# Patient Record
Sex: Male | Born: 1970 | Hispanic: No | Marital: Married
Health system: Northeastern US, Academic
[De-identification: ages and names within clinical notes are randomized; demographics above are authoritative.]

## PROBLEM LIST (undated history)

## (undated) DIAGNOSIS — K2270 Barrett's esophagus without dysplasia: Secondary | ICD-10-CM

## (undated) DIAGNOSIS — J302 Other seasonal allergic rhinitis: Secondary | ICD-10-CM

## (undated) HISTORY — PX: KNEE ARTHROSCOPY W/ MENISCAL REPAIR: SHX1877

## (undated) HISTORY — PX: KNEE ARTHROSCOPY W/ ACL RECONSTRUCTION: SHX1858

## (undated) HISTORY — PX: KNEE CARTILAGE SURGERY: SHX688

## (undated) HISTORY — PX: ANTERIOR CRUCIATE LIGAMENT REPAIR: SHX115

---

## 2013-08-25 ENCOUNTER — Encounter (HOSPITAL_COMMUNITY): Payer: Self-pay | Admitting: Emergency Medicine

## 2013-08-25 ENCOUNTER — Emergency Department (INDEPENDENT_AMBULATORY_CARE_PROVIDER_SITE_OTHER)
Admission: EM | Admit: 2013-08-25 | Discharge: 2013-08-25 | Disposition: A | Discharge status: Home / Self Care | Payer: Managed Care, Other (non HMO) | Source: Home / Self Care

## 2013-08-25 DIAGNOSIS — H9202 Otalgia, left ear: Secondary | ICD-10-CM

## 2013-08-25 DIAGNOSIS — H9209 Otalgia, unspecified ear: Secondary | ICD-10-CM

## 2013-08-25 DIAGNOSIS — R0982 Postnasal drip: Secondary | ICD-10-CM

## 2013-08-25 DIAGNOSIS — H6980 Other specified disorders of Eustachian tube, unspecified ear: Secondary | ICD-10-CM

## 2013-08-25 HISTORY — DX: Barrett's esophagus without dysplasia: K22.70

## 2013-08-25 HISTORY — DX: Other seasonal allergic rhinitis: J30.2

## 2013-08-25 MED ORDER — FLUTICASONE PROPIONATE 50 MCG/ACT NA SUSP: 2.0000 | Freq: Every day | NASAL | Status: DC

## 2013-08-25 NOTE — Discharge Instructions (Signed)
Otalgia May take Nyquil OR Allegra D Add the Flonase The most common reason for this in children is an infection of the middle ear. Pain from the middle ear is usually caused by a build-up of fluid and pressure behind the eardrum. Pain from an earache can be sharp, dull, or burning. The pain may be temporary or constant. The middle ear is connected to the nasal passages by a short narrow tube called the Eustachian tube. The Eustachian tube allows fluid to drain out of the middle ear, and helps keep the pressure in your ear equalized. CAUSES  A cold or allergy can block the Eustachian tube with inflammation and the build-up of secretions. This is especially likely in small children, because their Eustachian tube is shorter and more horizontal. When the Eustachian tube closes, the normal flow of fluid from the middle ear is stopped. Fluid can accumulate and cause stuffiness, pain, hearing loss, and an ear infection if germs start growing in this area. SYMPTOMS  The symptoms of an ear infection may include fever, ear pain, fussiness, increased crying, and irritability. Many children will have temporary and minor hearing loss during and right after an ear infection. Permanent hearing loss is rare, but the risk increases the more infections a child has. Other causes of ear pain include retained water in the outer ear canal from swimming and bathing. Ear pain in adults is less likely to be from an ear infection. Ear pain may be referred from other locations. Referred pain may be from the joint between your jaw and the skull. It may also come from a tooth problem or problems in the neck. Other causes of ear pain include:  A foreign body in the ear.  Outer ear infection.  Sinus infections.  Impacted ear wax.  Ear injury.  Arthritis of the jaw or TMJ problems.  Middle ear infection.  Tooth infections.  Sore throat with pain to the ears. DIAGNOSIS  Your caregiver can usually make the diagnosis by  examining you. Sometimes other special studies, including x-rays and lab work may be necessary. TREATMENT   If antibiotics were prescribed, use them as directed and finish them even if you or your child's symptoms seem to be improved.  Sometimes PE tubes are needed in children. These are little plastic tubes which are put into the eardrum during a simple surgical procedure. They allow fluid to drain easier and allow the pressure in the middle ear to equalize. This helps relieve the ear pain caused by pressure changes. HOME CARE INSTRUCTIONS   Only take over-the-counter or prescription medicines for pain, discomfort, or fever as directed by your caregiver. DO NOT GIVE CHILDREN ASPIRIN because of the association of Reye's Syndrome in children taking aspirin.  Use a cold pack applied to the outer ear for 15-20 minutes, 03-04 times per day or as needed may reduce pain. Do not apply ice directly to the skin. You may cause frost bite.  Over-the-counter ear drops used as directed may be effective. Your caregiver may sometimes prescribe ear drops.  Resting in an upright position may help reduce pressure in the middle ear and relieve pain.  Ear pain caused by rapidly descending from high altitudes can be relieved by swallowing or chewing gum. Allowing infants to suck on a bottle during airplane travel can help.  Do not smoke in the house or near children. If you are unable to quit smoking, smoke outside.  Control allergies. SEEK IMMEDIATE MEDICAL CARE IF:   You or  your child are becoming sicker.  Pain or fever relief is not obtained with medicine.  You or your child's symptoms (pain, fever, or irritability) do not improve within 24 to 48 hours or as instructed.  Severe pain suddenly stops hurting. This may indicate a ruptured eardrum.  You or your children develop new problems such as severe headaches, stiff neck, difficulty swallowing, or swelling of the face or around the ear. Document  Released: 12/11/2003 Document Revised: 07/18/2011 Document Reviewed: 04/16/2008 [facility] Patient Information 2014 [place], [place].

## 2013-08-25 NOTE — ED Notes (Signed)
C/O left earache x 3-4 days; describes increased pain when tilting head to shoulder.  Denies cold sxs - states has some nasal congestion, but he normally always does, though it may be increased some lately.

## 2013-08-25 NOTE — ED Provider Notes (Signed)
CSN: [id number]     Arrival date & time 08/25/13  1720 History   First MD Initiated Contact with Patient 08/25/13 1815     Chief Complaint  Patient presents with  . Otalgia   (Consider location/radiation/quality/duration/timing/severity/associated sxs/prior Treatment) HPI Comments: 43 year old male complaining of left ear pain for approximately 4 days. He has a history of acute and chronic PND and nasal congestion.   Past Medical History  Diagnosis Date  . Barrett's esophagus   . Seasonal allergies    Past Surgical History  Procedure Laterality Date  . Knee arthroscopy w/ acl reconstruction    . Knee cartilage surgery     No family history on file. History  Substance Use Topics  . Smoking status: Never Smoker   . Smokeless tobacco: Not on file  . Alcohol Use: Yes     Comment: occasional    Review of Systems  Constitutional: Negative for fever, chills and activity change.  HENT: Positive for congestion, ear pain, postnasal drip and rhinorrhea. Negative for ear discharge.   Respiratory: Negative.   Cardiovascular: Negative.   Neurological: Negative for dizziness, speech difficulty and numbness.    Allergies  Erythromycin  Home Medications   Prior to Admission medications   Medication Sig Start Date End Date Taking? Authorizing Provider  omeprazole (PRILOSEC) 20 MG capsule Take 20 mg by mouth daily.   Yes Historical Provider, MD   BP 131/91  Pulse 73  Temp(Src) 98.8 F (37.1 C) (Oral)  Resp 16  SpO2 97% Physical Exam  Nursing note and vitals reviewed. Constitutional: He is oriented to person, place, and time. He appears well-developed and well-nourished. No distress.  HENT:  Right Ear: External ear normal.  Mouth/Throat: No oropharyngeal exudate.  Left TM retracted. No erythema, effusion, bulging.  R TM nl OP with cobblestoning and clear PND  Eyes: Conjunctivae and EOM are normal.  Neck: Normal range of motion. Neck supple.  Cardiovascular: Normal rate.    Pulmonary/Chest: Effort normal. No respiratory distress.  Lymphadenopathy:    He has no cervical adenopathy.  Neurological: He is alert and oriented to person, place, and time.  Skin: Skin is warm.  Psychiatric: He has a normal mood and affect.    ED Course  Procedures (including critical care time) Labs Review Labs Reviewed - No data to display  No results found for this or any previous visit. Imaging Review No results found.   MDM   1. ETD (eustachian tube dysfunction)   2. Otalgia of left ear   3. PND (post-nasal drip)     R TM retraced no signs of infection or trauma Allegra D or NYquil as discussed. FLonase NS Lots of fluids    Hayden Rasmussenavid Brenlynn Fake, NP 08/25/13 [phone]

## 2013-08-26 NOTE — ED Provider Notes (Signed)
Medical screening examination/treatment/procedure(s) were performed by non-physician practitioner and as supervising physician I was immediately available for consultation/collaboration.  Leslee Homeavid Wynonna Fitzhenry, M.D.  Reuben Likesavid C Kassia Demarinis, MD 08/26/13 1120

## 2013-09-06 ENCOUNTER — Telehealth (HOSPITAL_COMMUNITY): Payer: Self-pay

## 2013-09-06 NOTE — ED Notes (Signed)
Patient called stating that RX was OTC and was too expensive.  Chart reviewed by Dr Denyse Amassorey.  He stated there was not a better alternative that would be cheaper.  Attempted to call patient there was no answer

## 2013-10-02 ENCOUNTER — Ambulatory Visit (HOSPITAL_BASED_OUTPATIENT_CLINIC_OR_DEPARTMENT_OTHER): Payer: Managed Care, Other (non HMO) | Attending: Emergency Medicine | Admitting: Radiology

## 2013-10-02 VITALS — Ht 67.0 in | Wt 155.0 lb

## 2013-10-02 DIAGNOSIS — G4733 Obstructive sleep apnea (adult) (pediatric): Secondary | ICD-10-CM | POA: Insufficient documentation

## 2013-10-06 DIAGNOSIS — G4730 Sleep apnea, unspecified: Secondary | ICD-10-CM

## 2013-10-06 DIAGNOSIS — G4710 Hypersomnia, unspecified: Secondary | ICD-10-CM

## 2013-10-06 NOTE — Sleep Study (Signed)
   NAME: Andrew Dawson DATE OF BIRTH:  [birth year] MEDICAL RECORD NUMBER [id number]  LOCATION: [facility]  PHYSICIAN: Clinton D Young  DATE OF STUDY: 10/02/2013  SLEEP STUDY TYPE: Nocturnal Polysomnogram               REFERRING PHYSICIAN: Charna Archer, MD  INDICATION FOR STUDY: Hypersomnia with sleep apnea  EPWORTH SLEEPINESS SCORE:   6/24 HEIGHT: 5\' 7"  (170.2 cm)  WEIGHT: 155 lb (70.308 kg)    Body mass index is 24.27 kg/(m^2).  NECK SIZE: 15.5 in.  MEDICATIONS: Charted for review  SLEEP ARCHITECTURE: Total sleep time 287 minutes with sleep efficiency 78.5%. Stage I was 3%, stage II 77.2%, stage III 0.2%, REM 19.7%. Sleep latency 5.5 minutes, REM latency 146.5 minutes, awake after sleep onset 73 minutes, arousal index 10.9, bedtime medication: None  RESPIRATORY DATA: Apnea hypopnea index (AHI) 6.5 per hour. 31 total events scored including 22 obstructive apneas, 4 central apneas, 5 hypopneas. Events were not positional. REM AHI 19.1 per hour. There were not enough early events to meet protocol requirements for split CPAP titration on this study.  OXYGEN DATA: Moderate snoring with oxygen desaturation to a nadir of 87% and mean oxygen saturation through the study of 97% on room air.  CARDIAC DATA: Sinus rhythm with PVCs  MOVEMENT/PARASOMNIA: No significant movement disturbance, bathroom x1  IMPRESSION/ RECOMMENDATION:   1) Mild obstructive sleep apnea/hypoxia syndrome, AHI 6.5 per hour with non-positional events. Moderate snoring with oxygen desaturation to a nadir of 87% and mean oxygen saturation through the study of 97% on room air. 2) There were not enough early events to meet protocol requirements for split CPAP titration on this study. If conservative management such as weight loss is not sufficient, then CPAP or an oral appliance might be considered on an individual basis. This patient can be scheduled for a dedicated CPAP titration study through the  [facility] if clinically appropriate.   Signed Jetty Duhamel M.D. Waymon Budge Diplomate, Biomedical engineer of Sleep Medicine  ELECTRONICALLY SIGNED ON:  10/06/2013, 11:40 AM [facility] PH: [phone]   FX: [phone] ACCREDITED BY THE [employer]ICAN AC[employer]EM[employer] OF SLEEP MEDICINE

## 2013-10-10 ENCOUNTER — Encounter (HOSPITAL_COMMUNITY): Payer: Self-pay | Admitting: Emergency Medicine

## 2013-10-10 ENCOUNTER — Emergency Department (INDEPENDENT_AMBULATORY_CARE_PROVIDER_SITE_OTHER)
Admission: EM | Admit: 2013-10-10 | Discharge: 2013-10-10 | Disposition: A | Discharge status: Home / Self Care | Payer: Managed Care, Other (non HMO) | Source: Home / Self Care

## 2013-10-10 DIAGNOSIS — S39012A Strain of muscle, fascia and tendon of lower back, initial encounter: Secondary | ICD-10-CM

## 2013-10-10 DIAGNOSIS — R198 Other specified symptoms and signs involving the digestive system and abdomen: Secondary | ICD-10-CM

## 2013-10-10 DIAGNOSIS — K047 Periapical abscess without sinus: Secondary | ICD-10-CM

## 2013-10-10 DIAGNOSIS — K044 Acute apical periodontitis of pulpal origin: Secondary | ICD-10-CM

## 2013-10-10 DIAGNOSIS — IMO0002 Reserved for concepts with insufficient information to code with codable children: Secondary | ICD-10-CM

## 2013-10-10 MED ORDER — METHYLPREDNISOLONE (PAK) 4 MG PO TABS: 4.0000 mg | ORAL_TABLET | Freq: Every day | ORAL | Status: DC

## 2013-10-10 MED ORDER — AMOXICILLIN 500 MG PO CAPS: 500.0000 mg | ORAL_CAPSULE | Freq: Three times a day (TID) | ORAL | Status: DC
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
## 2013-10-10 NOTE — Discharge Instructions (Signed)
Back Exercises These exercises may help you when beginning to rehabilitate your injury. Your symptoms may resolve with or without further involvement from your physician, physical therapist or athletic trainer. While completing these exercises, remember:   Restoring tissue flexibility helps normal motion to return to the joints. This allows healthier, less painful movement and activity.  An effective stretch should be held for at least 30 seconds.  A stretch should never be painful. You should only feel a gentle lengthening or release in the stretched tissue. STRETCH  Extension, Prone on Elbows   Lie on your stomach on the floor, a bed will be too soft. Place your palms about shoulder width apart and at the height of your head.  Place your elbows under your shoulders. If this is too painful, stack pillows under your chest.  Allow your body to relax so that your hips drop lower and make contact more completely with the floor.  Hold this position for __________ seconds.  Slowly return to lying flat on the floor. Repeat __________ times. Complete this exercise __________ times per day.  RANGE OF MOTION  Extension, Prone Press Ups   Lie on your stomach on the floor, a bed will be too soft. Place your palms about shoulder width apart and at the height of your head.  Keeping your back as relaxed as possible, slowly straighten your elbows while keeping your hips on the floor. You may adjust the placement of your hands to maximize your comfort. As you gain motion, your hands will come more underneath your shoulders.  Hold this position __________ seconds.  Slowly return to lying flat on the floor. Repeat __________ times. Complete this exercise __________ times per day.  RANGE OF MOTION- Quadruped, Neutral Spine   Assume a hands and knees position on a firm surface. Keep your hands under your shoulders and your knees under your hips. You may place padding under your knees for comfort.  Drop  your head and point your tail bone toward the ground below you. This will round out your low back like an angry cat. Hold this position for __________ seconds.  Slowly lift your head and release your tail bone so that your back sags into a large arch, like an old horse.  Hold this position for __________ seconds.  Repeat this until you feel limber in your low back.  Now, find your "sweet spot." This will be the most comfortable position somewhere between the two previous positions. This is your neutral spine. Once you have found this position, tense your stomach muscles to support your low back.  Hold this position for __________ seconds. Repeat __________ times. Complete this exercise __________ times per day.  STRETCH  Flexion, Single Knee to Chest   Lie on a firm bed or floor with both legs extended in front of you.  Keeping one leg in contact with the floor, bring your opposite knee to your chest. Hold your leg in place by either grabbing behind your thigh or at your knee.  Pull until you feel a gentle stretch in your low back. Hold __________ seconds.  Slowly release your grasp and repeat the exercise with the opposite side. Repeat __________ times. Complete this exercise __________ times per day.  STRETCH - Hamstrings, Standing  Stand or sit and extend your right / left leg, placing your foot on a chair or foot stool  Keeping a slight arch in your low back and your hips straight forward.  Lead with your chest and  lean forward at the waist until you feel a gentle stretch in the back of your right / left knee or thigh. (When done correctly, this exercise requires leaning only a small distance.)  Hold this position for __________ seconds. Repeat __________ times. Complete this stretch __________ times per day. STRENGTHENING  Deep Abdominals, Pelvic Tilt   Lie on a firm bed or floor. Keeping your legs in front of you, bend your knees so they are both pointed toward the ceiling and  your feet are flat on the floor.  Tense your lower abdominal muscles to press your low back into the floor. This motion will rotate your pelvis so that your tail bone is scooping upwards rather than pointing at your feet or into the floor.  With a gentle tension and even breathing, hold this position for __________ seconds. Repeat __________ times. Complete this exercise __________ times per day.  STRENGTHENING  Abdominals, Crunches   Lie on a firm bed or floor. Keeping your legs in front of you, bend your knees so they are both pointed toward the ceiling and your feet are flat on the floor. Cross your arms over your chest.  Slightly tip your chin down without bending your neck.  Tense your abdominals and slowly lift your trunk high enough to just clear your shoulder blades. Lifting higher can put excessive stress on the low back and does not further strengthen your abdominal muscles.  Control your return to the starting position. Repeat __________ times. Complete this exercise __________ times per day.  STRENGTHENING  Quadruped, Opposite UE/LE Lift   Assume a hands and knees position on a firm surface. Keep your hands under your shoulders and your knees under your hips. You may place padding under your knees for comfort.  Find your neutral spine and gently tense your abdominal muscles so that you can maintain this position. Your shoulders and hips should form a rectangle that is parallel with the floor and is not twisted.  Keeping your trunk steady, lift your right hand no higher than your shoulder and then your left leg no higher than your hip. Make sure you are not holding your breath. Hold this position __________ seconds.  Continuing to keep your abdominal muscles tense and your back steady, slowly return to your starting position. Repeat with the opposite arm and leg. Repeat __________ times. Complete this exercise __________ times per day. Document Released: 05/13/2005 Document  Revised: 07/18/2011 Document Reviewed: 08/07/2008 [facility] Patient Information 2014 [place], [place]. Abscessed Tooth An abscessed tooth is an infection around your tooth. It may be caused by holes or damage to the tooth (cavity) or a dental disease. An abscessed tooth causes mild to very bad pain in and around the tooth. See your dentist right away if you have tooth or gum pain. HOME CARE  Take your medicine as told. Finish it even if you start to feel better.  Do not drive after taking pain medicine.  Rinse your mouth (gargle) often with salt water ( teaspoon salt in 8 ounces of warm water).  Do not apply heat to the outside of your face. GET HELP RIGHT AWAY IF:   You have a temperature by mouth above 102 F (38.9 C), not controlled by medicine.  You have chills and a very bad headache.  You have problems breathing or swallowing.  Your mouth will not open.  You develop puffiness (swelling) on the neck or around the eye.  Your pain is not helped by medicine.  Your  pain is getting worse instead of better. MAKE SURE YOU:   Understand these instructions.  Will watch your condition.  Will get help right away if you are not doing well or get worse. Document Released: 10/12/2007 Document Revised: 07/18/2011 Document Reviewed: 08/03/2010 [facility] Patient Information 2014 [place], [place].

## 2013-10-11 NOTE — ED Provider Notes (Signed)
Medical screening examination/treatment/procedure(s) were performed by resident physician or non-physician practitioner and as supervising physician I was immediately available for consultation/collaboration.   Barkley Bruns MD.   Linna Hoff, MD 10/11/13 [phone]

## 2013-10-18 ENCOUNTER — Emergency Department (HOSPITAL_COMMUNITY): Payer: Managed Care, Other (non HMO)

## 2013-10-18 ENCOUNTER — Encounter (HOSPITAL_COMMUNITY): Payer: Self-pay | Admitting: Emergency Medicine

## 2013-10-18 DIAGNOSIS — Z8719 Personal history of other diseases of the digestive system: Secondary | ICD-10-CM | POA: Insufficient documentation

## 2013-10-18 DIAGNOSIS — R072 Precordial pain: Secondary | ICD-10-CM | POA: Insufficient documentation

## 2013-10-18 LAB — BASIC METABOLIC PANEL
BUN: 13 mg/dL (ref 6–23)
CO2: 23 mEq/L (ref 19–32)
Calcium: 9.4 mg/dL (ref 8.4–10.5)
Chloride: 100 mEq/L (ref 96–112)
Creatinine, Ser: 0.91 mg/dL (ref 0.50–1.35)
GFR calc Af Amer: >90 mL/min (ref >90)
Glucose, Bld: 138 mg/dL — ABNORMAL HIGH (ref 70–99)
POTASSIUM: 3.8 meq/L (ref 3.7–5.3)
SODIUM: 139 meq/L (ref 137–147)

## 2013-10-18 LAB — CBC
HCT: 38.8 % — ABNORMAL LOW (ref 39.0–52.0)
HEMOGLOBIN: 12.9 g/dL — AB (ref 13.0–17.0)
MCH: 27.1 pg (ref 26.0–34.0)
MCHC: 33.2 g/dL (ref 30.0–36.0)
MCV: 81.5 fL (ref 78.0–100.0)
PLATELETS: 318 10*3/uL (ref 150–400)
RBC: 4.76 MIL/uL (ref 4.22–5.81)
RDW: 13.1 % (ref 11.5–15.5)
WBC: 5.5 10*3/uL (ref 4.0–10.5)

## 2013-10-18 LAB — TROPONIN I: Troponin I: <0.3 ng/mL (ref <0.30)

## 2013-10-18 NOTE — ED Notes (Signed)
Pt reports intermittent cp for approx 2 days but today is worse. Pt describes pain as shooting into L chest lasting maybe at a time. Pt has slight sob. Denies nv. No cardiac hx. Pt skin warm and dry. No distress noted.

## 2013-10-19 ENCOUNTER — Emergency Department (HOSPITAL_COMMUNITY)
Admission: EM | Admit: 2013-10-19 | Discharge: 2013-10-19 | Disposition: A | Discharge status: Home / Self Care | Payer: Managed Care, Other (non HMO) | Attending: Emergency Medicine | Admitting: Emergency Medicine

## 2013-10-19 DIAGNOSIS — R079 Chest pain, unspecified: Secondary | ICD-10-CM

## 2013-10-19 LAB — TROPONIN I

## 2013-10-19 MED ORDER — GI COCKTAIL ~~LOC~~
30.0000 mL | Freq: Once | ORAL | Status: AC
  Administered 2013-10-19: 30 mL via ORAL
  Filled 2013-10-19: qty 30

## 2013-10-19 MED ORDER — ASPIRIN 81 MG PO CHEW
324.0000 mg | CHEWABLE_TABLET | Freq: Once | ORAL | Status: AC
  Administered 2013-10-19: 324 mg via ORAL
  Filled 2013-10-19: qty 4

## 2013-10-19 NOTE — ED Provider Notes (Signed)
CSN: [id number]     Arrival date & time 10/18/13  2224 History   First MD Initiated Contact with Patient 10/19/13 0026     Chief Complaint  Patient presents with  . Chest Pain   HPI  History provided by the patient. Patient is a 43 year old male with history of acid reflux and Barrett's esophagus who presents with complaints of intermittent left and substernal chest pain. Patient reports noticing intermittent sharp and uncomfortable chest pains for the past 2 days. He states pain seems to come at any time. It often lasts 20-30 minutes and then is gone for several hours. He has not noticed any worse pain with activity. He did not notice any change of symptoms or pain with eating or drinking. 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 He does report some increased stress with work but has not noticed any particular stress causing his pain symptoms to begin. He denies any associated shortness of breath, diaphoresis, nausea or lightheadedness. Pain does not radiate. Patient is a nondrinker and nonsmoker. He has no significant family history of ACS. Denies any pleuritic pain or hemoptysis. No other aggravating or alleviating factors. No other associated symptoms.    Past Medical History  Diagnosis Date  . Barrett's esophagus    Past Surgical History  Procedure Laterality Date  . Knee arthroscopy w/ meniscal repair    . Anterior cruciate ligament repair     No family history on file. History  Substance Use Topics  . Smoking status: Never Smoker   . Smokeless tobacco: Not on file  . Alcohol Use: Yes     Comment: occasional     Review of Systems  Constitutional: Negative for fever, chills and diaphoresis.  Respiratory: Negative for cough and shortness of breath.   Cardiovascular: Positive for chest pain. Negative for palpitations.  Gastrointestinal: Negative for nausea, vomiting, abdominal pain, diarrhea and constipation.  All other systems reviewed and  are negative.     Allergies  Azithromycin  Home Medications   Prior to Admission medications   Not on File   BP 124/86  Pulse 73  Temp(Src) 97.7 F (36.5 C) (Oral)  Resp 16  SpO2 99% Physical Exam  Nursing note and vitals reviewed. Constitutional: He is oriented to person, place, and time. He appears well-developed and well-nourished. No distress.  HENT:  Head: Normocephalic and atraumatic.  Cardiovascular: Normal rate and regular rhythm.   No murmur heard. Pulmonary/Chest: Effort normal and breath sounds normal. No respiratory distress. He has no wheezes. He has no rales. He exhibits no tenderness.  Abdominal: Soft. Bowel sounds are normal. There is no tenderness. There is no rebound and no guarding.  Musculoskeletal: Normal range of motion. He exhibits no edema and no tenderness.  Neurological: He is alert and oriented to person, place, and time.  Skin: Skin is warm. No rash noted. He is not diaphoretic. No erythema.    ED Course  Procedures  COORDINATION OF CARE:  Nursing notes reviewed. Vital signs reviewed. Initial pt interview and examination performed.   Filed Vitals:   10/18/13 2235 10/19/13 0017  BP: 144/92 124/86  Pulse: 73   Temp: 97.7 F (36.5 C)   TempSrc: Oral   Resp: 15 16  SpO2: 96% 99%    12:54 AM-patient seen and evaluated. Patient appears well no acute distress. He continues to feel a deep left-sided chest discomfort that is slightly sharp 4/10 pain. No other symptoms. Patient without any significant  cardiac history.  Patient reports improvement of symptoms after GI cocktail. He is feeling much better and resting. Second troponin negative. Patient low risk for ACS. Does have history of GERD and Barrett's esophagus. At this time we'll recommend continued outpatient evaluation of symptoms. She Critcher precautions given and patient agrees.   Treatment plan initiated: Medications  aspirin chewable tablet 324 mg (324 mg Oral Given 10/19/13 0106)   gi cocktail (Maalox,Lidocaine,Donnatal) (30 mLs Oral Given 10/19/13 0106)      Results for orders placed during the hospital encounter of 10/19/13  CBC      Result Value Ref Range   WBC 5.5  4.0 - 10.5 K/uL   RBC 4.76  4.22 - 5.81 MIL/uL   Hemoglobin 12.9 (*) 13.0 - 17.0 g/dL   HCT 16.138.8 (*) 09.639.0 - 04.552.0 %   MCV 81.5  78.0 - 100.0 fL   MCH 27.1  26.0 - 34.0 pg   MCHC 33.2  30.0 - 36.0 g/dL   RDW 40.913.1  81.111.5 - 91.415.5 %   Platelets 318  150 - 400 K/uL  BASIC METABOLIC PANEL      Result Value Ref Range   Sodium 139  137 - 147 mEq/L   Potassium 3.8  3.7 - 5.3 mEq/L   Chloride 100  96 - 112 mEq/L   CO2 23  19 - 32 mEq/L   Glucose, Bld 138 (*) 70 - 99 mg/dL   BUN 13  6 - 23 mg/dL   Creatinine, Ser 7.820.91  0.50 - 1.35 mg/dL   Calcium 9.4  8.4 - 95.610.5 mg/dL   GFR calc non Af Amer >90  >90 mL/min   GFR calc Af Amer >90  >90 mL/min  TROPONIN I      Result Value Ref Range   Troponin I <0.30  <0.30 ng/mL  TROPONIN I      Result Value Ref Range   Troponin I <0.30  <0.30 ng/mL      Imaging Review Dg Chest 2 View  10/18/2013   CLINICAL DATA:  Intermittent left chest pain  EXAM: CHEST  2 VIEW  COMPARISON:  None.  FINDINGS: Lungs are clear.  No pleural effusion or pneumothorax.  The heart is normal in size.  Mild degenerative changes of the visualized thoracolumbar spine.  IMPRESSION: No evidence of acute cardiopulmonary disease.   Electronically Signed   By: Charline BillsSriyesh  Krishnan M.D.   On: 10/18/2013 23:58     EKG Interpretation None      Date: 10/19/2013  Rate: 67  Rhythm: normal sinus rhythm  QRS Axis: normal  Intervals: normal  ST/T Wave abnormalities: normal  Conduction Disutrbances:none  Narrative Interpretation:   Old EKG Reviewed: none available     MDM   Final diagnoses:  Chest pain       Angus Sellereter S Nashika Coker, PA-C 10/19/13 [phone]

## 2013-10-19 NOTE — Discharge Instructions (Signed)
You were seen and evaluated for your symptoms of chest pain and discomfort. Your laboratory testing, EKG of your heart and chest x-ray have not shown any signs for a concerning for emergent cause of your symptoms. At this time your providers feel that you may return home and followup with a primary care provider for continued evaluation and treatment. Return at any time for changing or worsening symptoms.    Chest Pain (Nonspecific) It is often hard to give a specific diagnosis for the cause of chest pain. There is always a chance that your pain could be related to something serious, such as a heart attack or a blood clot in the lungs. You need to follow up with your caregiver for further evaluation. CAUSES   Heartburn.  Pneumonia or bronchitis.  Anxiety or stress.  Inflammation around your heart (pericarditis) or lung (pleuritis or pleurisy).  A blood clot in the lung.  A collapsed lung (pneumothorax). It can develop suddenly on its own (spontaneous pneumothorax) or from injury (trauma) to the chest.  Shingles infection (herpes zoster virus). The chest wall is composed of bones, muscles, and cartilage. Any of these can be the source of the pain.  The bones can be bruised by injury.  The muscles or cartilage can be strained by coughing or overwork.  The cartilage can be affected by inflammation and become sore (costochondritis). DIAGNOSIS  Lab tests or other studies, such as X-rays, electrocardiography, stress testing, or cardiac imaging, may be needed to find the cause of your pain.  TREATMENT   Treatment depends on what may be causing your chest pain. Treatment may include:  Acid blockers for heartburn.  Anti-inflammatory medicine.  Pain medicine for inflammatory conditions.  Antibiotics if an infection is present.  You may be advised to change lifestyle habits. This includes stopping smoking and avoiding alcohol, caffeine, and chocolate.  You may be advised to keep your  head raised (elevated) when sleeping. This reduces the chance of acid going backward from your stomach into your esophagus.  Most of the time, nonspecific chest pain will improve within 2 to 3 days with rest and mild pain medicine. HOME CARE INSTRUCTIONS   If antibiotics were prescribed, take your antibiotics as directed. Finish them even if you start to feel better.  For the next few days, avoid physical activities that bring on chest pain. Continue physical activities as directed.  Do not smoke.  Avoid drinking alcohol.  Only take over-the-counter or prescription medicine for pain, discomfort, or fever as directed by your caregiver.  Follow your caregiver's suggestions for further testing if your chest pain does not go away.  Keep any follow-up appointments you made. If you do not go to an appointment, you could develop lasting (chronic) problems with pain. If there is any problem keeping an appointment, you must call to reschedule. SEEK MEDICAL CARE IF:   You think you are having problems from the medicine you are taking. Read your medicine instructions carefully.  Your chest pain does not go away, even after treatment.  You develop a rash with blisters on your chest. SEEK IMMEDIATE MEDICAL CARE IF:   You have increased chest pain or pain that spreads to your arm, neck, jaw, back, or abdomen.  You develop shortness of breath, an increasing cough, or you are coughing up blood.  You have severe back or abdominal pain, feel nauseous, or vomit.  You develop severe weakness, fainting, or chills.  You have a fever. THIS IS AN EMERGENCY. Do  not wait to see if the pain will go away. Get medical help at once. Call your local emergency services (911 in [place] Do not drive yourself to the hospital. MAKE SURE YOU:   Understand these instructions.  Will watch your condition.  Will get help right away if you are not doing well or get worse. Document Released: 02/02/2005 Document  Revised: 07/18/2011 Document Reviewed: 11/29/2007 [facility] Patient Information 2014 [place], [place].

## 2013-10-25 NOTE — ED Provider Notes (Signed)
Medical screening examination/treatment/procedure(s) were performed by non-physician practitioner and as supervising physician I was immediately available for consultation/collaboration.   EKG Interpretation   Date/Time:  Friday October 18 2013 22:29:57 EDT Ventricular Rate:  67 PR Interval:  124 QRS Duration: 80 QT Interval:  394 QTC Calculation: 416 R Axis:   43 Text Interpretation:  Normal sinus rhythm Normal ECG ED PHYSICIAN  INTERPRETATION AVAILABLE IN [facility] Confirmed by TEST, Record  ([id number]) on 10/20/2013 9:14:09 AM        Brandt LoosenJulie Evita Merida, MD 10/25/13 [phone]

## 2014-02-11 DIAGNOSIS — M2352 Chronic instability of knee, left knee: Secondary | ICD-10-CM | POA: Insufficient documentation

## 2014-02-11 DIAGNOSIS — M2351 Chronic instability of knee, right knee: Secondary | ICD-10-CM | POA: Insufficient documentation

## 2014-02-11 DIAGNOSIS — M94269 Chondromalacia, unspecified knee: Secondary | ICD-10-CM | POA: Insufficient documentation

## 2014-02-12 ENCOUNTER — Encounter (HOSPITAL_COMMUNITY): Payer: Self-pay | Admitting: Emergency Medicine

## 2014-02-12 ENCOUNTER — Emergency Department (INDEPENDENT_AMBULATORY_CARE_PROVIDER_SITE_OTHER)
Admission: EM | Admit: 2014-02-12 | Discharge: 2014-02-12 | Disposition: A | Discharge status: Home / Self Care | Payer: Managed Care, Other (non HMO) | Source: Home / Self Care

## 2014-02-12 DIAGNOSIS — J0110 Acute frontal sinusitis, unspecified: Secondary | ICD-10-CM

## 2014-02-12 DIAGNOSIS — R05 Cough: Secondary | ICD-10-CM

## 2014-02-12 DIAGNOSIS — J309 Allergic rhinitis, unspecified: Secondary | ICD-10-CM

## 2014-02-12 DIAGNOSIS — R059 Cough, unspecified: Secondary | ICD-10-CM

## 2014-02-12 DIAGNOSIS — R062 Wheezing: Secondary | ICD-10-CM

## 2014-02-12 MED ORDER — FLUTICASONE PROPIONATE 50 MCG/ACT NA SUSP: 2.0000 | Freq: Every day | NASAL | Status: DC

## 2014-02-12 MED ORDER — PREDNISONE 20 MG PO TABS: 60.0000 mg | ORAL_TABLET | Freq: Every day | ORAL | Status: DC

## 2014-02-12 MED ORDER — IPRATROPIUM-ALBUTEROL 0.5-2.5 (3) MG/3ML IN SOLN
3.0000 mL | Freq: Once | RESPIRATORY_TRACT | Status: AC
  Administered 2014-02-12: 3 mL via RESPIRATORY_TRACT

## 2014-02-12 MED ORDER — IPRATROPIUM-ALBUTEROL 0.5-2.5 (3) MG/3ML IN SOLN
RESPIRATORY_TRACT | Status: AC
  Filled 2014-02-12: qty 3

## 2014-02-12 MED ORDER — AMOXICILLIN 500 MG PO CAPS: 500.0000 mg | ORAL_CAPSULE | Freq: Three times a day (TID) | ORAL | Status: DC

## 2014-02-12 MED ORDER — CETIRIZINE-PSEUDOEPHEDRINE ER 5-120 MG PO TB12: 1.0000 | ORAL_TABLET | Freq: Every day | ORAL | Status: DC

## 2014-02-12 MED ORDER — ALBUTEROL SULFATE HFA 108 (90 BASE) MCG/ACT IN AERS: 1.0000 | INHALATION_SPRAY | Freq: Four times a day (QID) | RESPIRATORY_TRACT | Status: DC | PRN

## 2014-02-12 NOTE — ED Notes (Signed)
Patient complains of cough congestion fever the past four days Patient is using OTC medications with no relief

## 2014-02-12 NOTE — Discharge Instructions (Signed)
A neti pot is a container designed to rinse debris or mucus from your nasal cavity. You might use a neti pot to treat symptoms of nasal allergies, sinus problems or colds. °If you choose to make your own saltwater solution, it's important to use bottled water that has been distilled or sterilized. Tap water is acceptable if it's been boiled for several minutes and then left to cool until it is lukewarm. °To use the neti pot, tilt your head sideways over the sink and place the spout of the neti pot in the upper nostril. Breathing through your open mouth, gently pour the saltwater solution into your upper nostril so that the liquid drains through the lower nostril. Repeat on the other side. °Be sure to rinse the irrigation device after each use with similarly distilled, sterile, previously boiled and cooled, or filtered water and leave open to air dry. °Neti pots are often available in pharmacies and health food stores, as well online.  ° ° °Bronchospasm °A bronchospasm is when the tubes that carry air in and out of your lungs (airways) spasm or tighten. During a bronchospasm it is hard to breathe. This is because the airways get smaller. A bronchospasm can be triggered by: °· Allergies. These may be to animals, pollen, food, or mold. °· Infection. This is a common cause of bronchospasm. °· Exercise. °· Irritants. These include pollution, cigarette smoke, strong odors, aerosol sprays, and paint fumes. °· Weather changes. °· Stress. °· Being emotional. °HOME CARE  °· Always have a plan for getting help. Know when to call your doctor and local emergency services (911 in the [place] Know where you can get emergency care. °· Only take medicines as told by your doctor. °· If you were prescribed an inhaler or nebulizer machine, ask your doctor how to use it correctly. Always use a spacer with your inhaler if you were given one. °· Stay calm during an attack. Try to relax and breathe more slowly. °· Control your home  environment: °¨ Change your heating and air conditioning filter at least once a month. °¨ Limit your use of fireplaces and wood stoves. °¨ Do not  smoke. Do not  allow smoking in your home. °¨ Avoid perfumes and fragrances. °¨ Get rid of pests (such as roaches and mice) and their droppings. °¨ Throw away plants if you see mold on them. °¨ Keep your house clean and dust free. °¨ Replace carpet with wood, tile, or vinyl flooring. Carpet can trap dander and dust. °¨ Use allergy-proof pillows, mattress covers, and box spring covers. °¨ Wash bed sheets and blankets every week in hot water. Dry them in a dryer. °¨ Use blankets that are made of polyester or cotton. °¨ Wash hands frequently. °GET HELP IF: °· You have muscle aches. °· You have chest pain. °· The thick spit you spit or cough up (sputum) changes from clear or white to yellow, green, gray, or bloody. °· The thick spit you spit or cough up gets thicker. °· There are problems that may be related to the medicine you are given such as: °¨ A rash. °¨ Itching. °¨ Swelling. °¨ Trouble breathing. °GET HELP RIGHT AWAY IF: °· You feel you cannot breathe or catch your breath. °· You cannot stop coughing. °· Your treatment is not helping you breathe better. °· You have very bad chest pain. °MAKE SURE YOU:  °· Understand these instructions. °· Will watch your condition. °· Will get help right away if you are not   doing well or get worse. °Document Released: 02/20/2009 Document Revised: 04/30/2013 Document Reviewed: 10/16/2012 °Exit[employer]® Patient Information ©2015 [employer]. This information is not intended to replace advice given to you by your health care provider. Make sure you discuss any questions you have with your health care provider. ° ° °

## 2014-02-12 NOTE — ED Provider Notes (Signed)
CSN: [id number]     Arrival date & time 02/12/14  1923 History   None    Chief Complaint  Patient presents with  . Nasal Congestion   (Consider location/radiation/quality/duration/timing/severity/associated sxs/prior Treatment)  HPI  Patient is a 43 year old male presenting today with complaints of cough,congestion and difficulty breathing through his nose for approximately the past 3-4 days. Patient states he has had an intermittent low-grade fever over the past few days and reports some sneezing and itching. Patient states he's been taking Tylenol, Allegra and Flonase with minimal relief.  Past Medical History  Diagnosis Date  . Barrett's esophagus    Past Surgical History  Procedure Laterality Date  . Knee arthroscopy w/ meniscal repair    . Anterior cruciate ligament repair     No family history on file. History  Substance Use Topics  . Smoking status: Never Smoker   . Smokeless tobacco: Not on file  . Alcohol Use: Yes     Comment: occasional     Review of Systems  Constitutional: Positive for fever and fatigue.  HENT: Positive for congestion, postnasal drip, rhinorrhea, sinus pressure and sneezing. Negative for ear pain, facial swelling, sore throat and trouble swallowing.   Eyes: Negative.   Respiratory: Positive for cough and shortness of breath. Negative for choking, chest tightness, wheezing and stridor.   Cardiovascular: Negative.  Negative for chest pain, palpitations and leg swelling.  Gastrointestinal: Negative.  Negative for nausea, vomiting, diarrhea and constipation.  Endocrine: Negative.   Genitourinary: Negative.   Musculoskeletal: Negative.  Negative for arthralgias, joint swelling, myalgias, neck pain and neck stiffness.  Skin: Negative.  Negative for rash.  Allergic/Immunologic: Positive for environmental allergies.  Neurological: Negative.   Hematological: Negative.   Psychiatric/Behavioral: Negative.     Allergies  Erythromycin  Home  Medications   Prior to Admission medications   Medication Sig Start Date End Date Taking? Authorizing Provider  Acetaminophen (TYLENOL PO) Take 400 mg by mouth 2 (two) times daily.    Historical Provider, MD  albuterol (PROVENTIL HFA;VENTOLIN HFA) 108 (90 BASE) MCG/ACT inhaler Inhale 1-2 puffs into the lungs every 6 (six) hours as needed for wheezing or shortness of breath. 02/12/14   Servando Salina, NP  amoxicillin (AMOXIL) 500 MG capsule Take 1 capsule (500 mg total) by mouth 3 (three) times daily. 02/12/14   Servando Salina, NP  cetirizine-pseudoephedrine (ZYRTEC-D) 5-120 MG per tablet Take 1 tablet by mouth daily. 02/12/14   Servando Salina, NP  fluticasone (FLONASE) 50 MCG/ACT nasal spray Place 2 sprays into both nostrils daily. 02/12/14   Servando Salina, NP  omeprazole (PRILOSEC) 20 MG capsule Take 20 mg by mouth daily.    Historical Provider, MD  predniSONE (DELTASONE) 20 MG tablet Take 3 tablets (60 mg total) by mouth daily. 02/12/14   Servando Salina, NP   BP 140/95  Pulse 92  Temp(Src) 99.2 F (37.3 C) (Oral)  Resp 16  SpO2 98%  Physical Exam  Nursing note and vitals reviewed. Constitutional: He is oriented to person, place, and time. He appears well-developed and well-nourished. No distress.  HENT:  Head: Normocephalic and atraumatic.  Right Ear: External ear normal.  Left Ear: External ear normal.  Mouth/Throat: Oropharynx is clear and moist.  Mild erythema noted in posterior oropharynx. No evidence of exit or patches. Small amount of green yellow mucoid discharge present. Bilateral tympanic membranes pearly gray in appearance with light reflexes present and bony prominences visualized. Bilateral nasal turbinates boggy and swollen  in appearance patient is able to force air through them for patency.  Neck: Normal range of motion. Neck supple.  Mild anterior cervical lymphadenopathy present bilaterally. Negative nuchal rigidity.  Cardiovascular: Normal rate, regular  rhythm, normal heart sounds and intact distal pulses.  Exam reveals no gallop and no friction rub.   No murmur heard. Pulmonary/Chest: No respiratory distress. He has wheezes. He has no rales. He exhibits no tenderness.  Expiratory wheezes noted with prolonged expiration and coughing with deep breathing.  Musculoskeletal: Normal range of motion. He exhibits no edema and no tenderness.  Lymphadenopathy:    He has cervical adenopathy.  Neurological: He is alert and oriented to person, place, and time.  Skin: Skin is warm and dry. No rash noted. He is not diaphoretic.   Patient's lungs reevaluated following DuoNeb treatment. No evidence of expiratory wheezing. Patient able to take a good full deep breath without prolonged expiration or coughing.   ED Course  Procedures (including critical care time) Labs Review Labs Reviewed - No data to display  Imaging Review No results found.   MDM   1. Allergic rhinitis, unspecified allergic rhinitis type   2. Wheezing   3. Cough   4. Acute frontal sinusitis, recurrence not specified    Meds ordered this encounter  Medications  . ipratropium-albuterol (DUONEB) 0.5-2.5 (3) MG/3ML nebulizer solution 3 mL    Sig:   . predniSONE (DELTASONE) 20 MG tablet    Sig: Take 3 tablets (60 mg total) by mouth daily.    Dispense:  15 tablet    Refill:  0  . fluticasone (FLONASE) 50 MCG/ACT nasal spray    Sig: Place 2 sprays into both nostrils daily.    Dispense:  16 g    Refill:  2  . albuterol (PROVENTIL HFA;VENTOLIN HFA) 108 (90 BASE) MCG/ACT inhaler    Sig: Inhale 1-2 puffs into the lungs every 6 (six) hours as needed for wheezing or shortness of breath.    Dispense:  1 Inhaler    Refill:  2  . amoxicillin (AMOXIL) 500 MG capsule    Sig: Take 1 capsule (500 mg total) by mouth 3 (three) times daily.    Dispense:  21 capsule    Refill:  0  . cetirizine-pseudoephedrine (ZYRTEC-D) 5-120 MG per tablet    Sig: Take 1 tablet by mouth daily.     Dispense:  15 tablet    Refill:  0   Discussed antibiotic every use with patient patient given option to fill antibiotic at his discretion. Discussed the importance of actively treating allergies to prevent exacerbation and subsequent sinus infections. The patient verbalizes understanding and agrees to plan of care.       Servando Salinaatherine H Rossi, NP 02/12/14 2152

## 2014-02-14 ENCOUNTER — Other Ambulatory Visit: Payer: Self-pay | Admitting: Rehabilitation

## 2014-02-14 DIAGNOSIS — M2351 Chronic instability of knee, right knee: Secondary | ICD-10-CM

## 2014-02-14 DIAGNOSIS — M94261 Chondromalacia, right knee: Secondary | ICD-10-CM

## 2014-02-15 NOTE — ED Provider Notes (Signed)
Medical screening examination/treatment/procedure(s) were performed by a resident physician or non-physician practitioner and as the supervising physician I was immediately available for consultation/collaboration.  Evan Corey, MD    Evan S Corey, MD 02/15/14 0921 

## 2014-02-19 ENCOUNTER — Other Ambulatory Visit: Payer: Managed Care, Other (non HMO)

## 2014-02-21 ENCOUNTER — Other Ambulatory Visit: Payer: Managed Care, Other (non HMO)

## 2014-02-27 ENCOUNTER — Other Ambulatory Visit: Payer: Managed Care, Other (non HMO)

## 2014-02-27 ENCOUNTER — Ambulatory Visit
Admission: RE | Admit: 2014-02-27 | Discharge: 2014-02-27 | Disposition: A | Discharge status: Home / Self Care | Payer: Managed Care, Other (non HMO) | Source: Ambulatory Visit | Attending: Rehabilitation | Admitting: Rehabilitation

## 2014-02-27 DIAGNOSIS — M94261 Chondromalacia, right knee: Secondary | ICD-10-CM

## 2014-02-27 DIAGNOSIS — M2351 Chronic instability of knee, right knee: Secondary | ICD-10-CM

## 2014-03-03 ENCOUNTER — Other Ambulatory Visit: Payer: Managed Care, Other (non HMO)

## 2014-03-31 ENCOUNTER — Encounter (HOSPITAL_COMMUNITY): Payer: Self-pay | Admitting: Emergency Medicine

## 2014-03-31 ENCOUNTER — Emergency Department (HOSPITAL_COMMUNITY)
Admission: EM | Admit: 2014-03-31 | Discharge: 2014-03-31 | Disposition: A | Discharge status: Home / Self Care | Payer: Managed Care, Other (non HMO) | Source: Home / Self Care | Attending: Family Medicine | Admitting: Family Medicine

## 2014-03-31 DIAGNOSIS — R69 Illness, unspecified: Principal | ICD-10-CM

## 2014-03-31 DIAGNOSIS — J111 Influenza due to unidentified influenza virus with other respiratory manifestations: Secondary | ICD-10-CM

## 2014-03-31 MED ORDER — OSELTAMIVIR PHOSPHATE 75 MG PO CAPS: 75.0000 mg | ORAL_CAPSULE | Freq: Two times a day (BID) | ORAL | Status: DC

## 2014-03-31 MED ORDER — IPRATROPIUM BROMIDE 0.06 % NA SOLN: 2.0000 | Freq: Four times a day (QID) | NASAL | Status: DC

## 2014-03-31 MED ORDER — ACETAMINOPHEN 325 MG PO TABS
ORAL_TABLET | ORAL | Status: AC
  Filled 2014-03-31: qty 2

## 2014-03-31 MED ORDER — ACETAMINOPHEN 325 MG PO TABS
650.0000 mg | ORAL_TABLET | Freq: Once | ORAL | Status: AC
  Administered 2014-03-31: 650 mg via ORAL

## 2014-03-31 NOTE — ED Notes (Signed)
Fever, chills, loose stools x 4, nausea, no vomiting.

## 2014-03-31 NOTE — ED Provider Notes (Signed)
CSN: [id number]     Arrival date & time 03/31/14  2000 History   First MD Initiated Contact with Patient 03/31/14 2005     Chief Complaint  Patient presents with  . Fever   (Consider location/radiation/quality/duration/timing/severity/associated sxs/prior Treatment) Patient is a 43 y.o. male presenting with fever. The history is provided by the patient.  Fever Max temp prior to arrival:  101 Temp source:  Oral Severity:  Moderate Onset quality:  Gradual Duration:  1 day Chronicity:  New Associated symptoms: chills and diarrhea   Associated symptoms: no congestion, no cough, no nausea, no rhinorrhea, no sore throat and no vomiting     Past Medical History  Diagnosis Date  . Barrett's esophagus    Past Surgical History  Procedure Laterality Date  . Knee arthroscopy w/ meniscal repair    . Anterior cruciate ligament repair     No family history on file. History  Substance Use Topics  . Smoking status: Never Smoker   . Smokeless tobacco: Not on file  . Alcohol Use: Yes     Comment: occasional     Review of Systems  Constitutional: Positive for fever and chills.  HENT: Negative.  Negative for congestion, rhinorrhea and sore throat.   Respiratory: Negative for cough.   Gastrointestinal: Positive for diarrhea. Negative for nausea and vomiting.    Allergies  Erythromycin  Home Medications   Prior to Admission medications   Medication Sig Start Date End Date Taking? Authorizing Provider  Acetaminophen (TYLENOL PO) Take 400 mg by mouth 2 (two) times daily.    Historical Provider, MD  albuterol (PROVENTIL HFA;VENTOLIN HFA) 108 (90 BASE) MCG/ACT inhaler Inhale 1-2 puffs into the lungs every 6 (six) hours as needed for wheezing or shortness of breath. 02/12/14   Servando Salinaatherine H Rossi, NP  amoxicillin (AMOXIL) 500 MG capsule Take 1 capsule (500 mg total) by mouth 3 (three) times daily. 02/12/14   Servando Salinaatherine H Rossi, NP  cetirizine-pseudoephedrine (ZYRTEC-D) 5-120 MG per tablet Take  1 tablet by mouth daily. 02/12/14   Servando Salinaatherine H Rossi, NP  fluticasone (FLONASE) 50 MCG/ACT nasal spray Place 2 sprays into both nostrils daily. 02/12/14   Servando Salinaatherine H Rossi, NP  ipratropium (ATROVENT) 0.06 % nasal spray Place 2 sprays into both nostrils 4 (four) times daily. 03/31/14   Linna HoffJames D Eliseo Withers, MD  omeprazole (PRILOSEC) 20 MG capsule Take 20 mg by mouth daily.    Historical Provider, MD  oseltamivir (TAMIFLU) 75 MG capsule Take 1 capsule (75 mg total) by mouth every 12 (twelve) hours. Take all of medication. 03/31/14   Linna HoffJames D Aster Screws, MD  predniSONE (DELTASONE) 20 MG tablet Take 3 tablets (60 mg total) by mouth daily. 02/12/14   Servando Salinaatherine H Rossi, NP   BP 126/87 mmHg  Pulse 105  Temp(Src) 101.5 F (38.6 C) (Oral)  Resp 20  SpO2 98% Physical Exam  Constitutional: He is oriented to person, place, and time. He appears well-developed and well-nourished.  HENT:  Right Ear: External ear normal.  Left Ear: External ear normal.  Mouth/Throat: Oropharynx is clear and moist.  Eyes: Conjunctivae are normal. Pupils are equal, round, and reactive to light.  Neck: Normal range of motion. Neck supple.  Cardiovascular: Regular rhythm, normal heart sounds and intact distal pulses.   Pulmonary/Chest: Effort normal and breath sounds normal.  Abdominal: Soft. Bowel sounds are normal. There is no tenderness.  Neurological: He is alert and oriented to person, place, and time.  Skin: Skin is warm and dry.  Nursing note and vitals reviewed.   ED Course  Procedures (including critical care time) Labs Review Labs Reviewed - No data to display  Imaging Review No results found.   MDM   1. Influenza-like illness        Linna HoffJames D Rhealyn Cullen, MD 03/31/14 2028

## 2014-04-30 ENCOUNTER — Emergency Department (INDEPENDENT_AMBULATORY_CARE_PROVIDER_SITE_OTHER): Payer: Managed Care, Other (non HMO)

## 2014-04-30 ENCOUNTER — Encounter (HOSPITAL_COMMUNITY): Payer: Self-pay | Admitting: *Deleted

## 2014-04-30 ENCOUNTER — Emergency Department (HOSPITAL_COMMUNITY)
Admission: EM | Admit: 2014-04-30 | Discharge: 2014-04-30 | Disposition: A | Discharge status: Home / Self Care | Payer: Managed Care, Other (non HMO) | Source: Home / Self Care

## 2014-04-30 DIAGNOSIS — G8929 Other chronic pain: Secondary | ICD-10-CM

## 2014-04-30 DIAGNOSIS — R519 Headache, unspecified: Secondary | ICD-10-CM

## 2014-04-30 DIAGNOSIS — M25532 Pain in left wrist: Secondary | ICD-10-CM

## 2014-04-30 DIAGNOSIS — R51 Headache: Secondary | ICD-10-CM

## 2014-04-30 DIAGNOSIS — J0110 Acute frontal sinusitis, unspecified: Secondary | ICD-10-CM

## 2014-04-30 DIAGNOSIS — S52612P Displaced fracture of left ulna styloid process, subsequent encounter for closed fracture with malunion: Secondary | ICD-10-CM

## 2014-04-30 DIAGNOSIS — W19XXXA Unspecified fall, initial encounter: Secondary | ICD-10-CM

## 2014-04-30 DIAGNOSIS — S63502A Unspecified sprain of left wrist, initial encounter: Secondary | ICD-10-CM

## 2014-04-30 MED ORDER — KETOROLAC TROMETHAMINE 10 MG PO TABS: 10.0000 mg | ORAL_TABLET | Freq: Four times a day (QID) | ORAL | Status: DC | PRN

## 2014-04-30 NOTE — ED Provider Notes (Signed)
CSN: [id number]     Arrival date & time 04/30/14  1945 History   None    Chief Complaint  Patient presents with  . Wrist Injury   (Consider location/radiation/quality/duration/timing/severity/associated sxs/prior Treatment) HPI Comments: 43 year old male comes to the urgent care with 2 complaints. #1 is pain in the left wrist for 3 weeks after he fell on an outstretched arm. Second complaint is headache that started  today. Located across the fore head and bitemporal areas.  He denies problems with vision, speech, hearing, swallowing. Denies focal paresthesias or weakness. He states that he has nasal congestion and sinus congestion. 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  Patient is a 43 y.o. male presenting with wrist injury.  Wrist Injury   Past Medical History  Diagnosis Date  . Barrett's esophagus    Past Surgical History  Procedure Laterality Date  . Knee arthroscopy w/ meniscal repair    . Anterior cruciate ligament repair     History reviewed. No pertinent family history. History  Substance Use Topics  . Smoking status: Never Smoker   . Smokeless tobacco: Not on file  . Alcohol Use: Yes     Comment: occasional     Review of Systems  Constitutional: Negative.   HENT: Positive for congestion, postnasal drip and sinus pressure. Negative for ear pain and sore throat.   Eyes: Negative for photophobia, pain, discharge, redness, itching and visual disturbance.  Respiratory: Negative.   Cardiovascular: Negative for chest pain.  Gastrointestinal: Negative.   Musculoskeletal:       As per HPI  Skin: Negative.   Neurological: Positive for light-headedness and headaches. Negative for tremors, seizures, syncope, speech difficulty and numbness.    Allergies  Erythromycin  Home Medications   Prior to Admission medications   Medication Sig Start Date End Date Taking? Authorizing Provider  omeprazole  (PRILOSEC) 20 MG capsule Take 20 mg by mouth daily.   Yes Historical Provider, MD  Acetaminophen (TYLENOL PO) Take 400 mg by mouth 2 (two) times daily.    Historical Provider, MD  albuterol (PROVENTIL HFA;VENTOLIN HFA) 108 (90 BASE) MCG/ACT inhaler Inhale 1-2 puffs into the lungs every 6 (six) hours as needed for wheezing or shortness of breath. 02/12/14   Servando Salinaatherine H Rossi, NP  amoxicillin (AMOXIL) 500 MG capsule Take 1 capsule (500 mg total) by mouth 3 (three) times daily. 02/12/14   Servando Salinaatherine H Rossi, NP  cetirizine-pseudoephedrine (ZYRTEC-D) 5-120 MG per tablet Take 1 tablet by mouth daily. 02/12/14   Servando Salinaatherine H Rossi, NP  fluticasone (FLONASE) 50 MCG/ACT nasal spray Place 2 sprays into both nostrils daily. 02/12/14   Servando Salinaatherine H Rossi, NP  ipratropium (ATROVENT) 0.06 % nasal spray Place 2 sprays into both nostrils 4 (four) times daily. 03/31/14   Linna HoffJames D Kindl, MD  ketorolac (TORADOL) 10 MG tablet Take 1 tablet (10 mg total) by mouth 4 (four) times daily as needed. 04/30/14   Hayden Rasmussenavid Sabrena Gavitt, NP  oseltamivir (TAMIFLU) 75 MG capsule Take 1 capsule (75 mg total) by mouth every 12 (twelve) hours. Take all of medication. 03/31/14   Linna HoffJames D Kindl, MD  predniSONE (DELTASONE) 20 MG tablet Take 3 tablets (60 mg total) by mouth daily. 02/12/14   Servando Salinaatherine H Rossi, NP   BP 133/87 mmHg  Pulse 73  Temp(Src) 98.3 F (36.8 C) (Oral)  Resp 18  SpO2 97% Physical Exam  Constitutional: He is oriented to person, place, and  time. He appears well-developed and well-nourished. No distress.  HENT:  Head: Normocephalic and atraumatic.  Right Ear: External ear normal.  Left Ear: External ear normal.  Mouth/Throat: Oropharynx is clear and moist. No oropharyngeal exudate.  Eyes: Conjunctivae and EOM are normal. Pupils are equal, round, and reactive to light.  Neck: Normal range of motion. Neck supple.  Cardiovascular: Normal rate, regular rhythm and normal heart sounds.   Pulmonary/Chest: Effort normal and breath sounds  normal. No respiratory distress.  Musculoskeletal:  No tenderness to the left wrist while positioned in extension. Pain is produced with hyper extension, hyper flexion and rotation of the wrist as when opening a door knob. There is no swelling, deformity or color changes. Distal neurovascular motor Sentry is intact. Radial pulses 2+.  Lymphadenopathy:    He has no cervical adenopathy.  Neurological: He is alert and oriented to person, place, and time. He has normal strength. No cranial nerve deficit or sensory deficit. He exhibits normal muscle tone. He displays a negative Romberg sign. Coordination and gait normal. GCS eye subscore is 4. GCS verbal subscore is 5. GCS motor subscore is 6.  Heel to toe gait is normal  Skin: Skin is warm and dry.  Nursing note and vitals reviewed.   ED Course  Procedures (including critical care time) Labs Review Labs Reviewed - No data to display  Imaging Review Dg Wrist Complete Left  04/30/2014   CLINICAL DATA:  Fall 3 weeks ago.  Left wrist pain.  EXAM: LEFT WRIST - COMPLETE 3+ VIEW  COMPARISON:  None.  FINDINGS: Nonunited fracture, ulnar styloid, chronic and well corticated. No acute fracture or acute bony findings. Three views are provided, and a dedicated scaphoid view was not performed.  IMPRESSION: 1. 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 No acute fracture is identified. 2. If the patient's tenderness is in the vicinity of the scaphoid/anatomic snuffbox, then cross-sectional imaging or presumptive treatment for occult scaphoid fracture might be considered.   Electronically Signed   By: Herbie BaltimoreWalt  Liebkemann M.D.   On: 04/30/2014 20:55     MDM   1. Acute frontal sinusitis, recurrence not specified   2. Fall   3. Wrist pain, chronic, left   4. Acute nonintractable headache, unspecified headache type   5. Left wrist sprain, initial encounter   6. Fracture of ulnar styloid, left, closed, with malunion, subsequent encounter     Gross neurologic exam is unremarkable. Headache likely sinusitis vs tension . He st he recently obtained progressive lenses and does not like them. Eye strain is another consideration.  Wrist splint.  F/U with Dr. Melvyn Novasrtmann as above. toradol 10 mg po as dir Obtain a PCP      Hayden Rasmussenavid Karris Deangelo, NP 04/30/14 2110

## 2014-04-30 NOTE — ED Notes (Addendum)
C/o pain L wrist pain after falling down in his BR 3 weeks ago. He thinks he landed on his palm. Has been using a balm and an ace bandage.  Also c/o severe headache today.  No nausea or photophobia.  Has not taken anything for it. He blew his nose, he almost blacked out.  Has had some head congestion. No earache or sore throat.

## 2014-04-30 NOTE — Discharge Instructions (Signed)
Sinus Headache A sinus headache is when your sinuses become clogged or swollen. Sinus headaches can range from mild to severe.  CAUSES A sinus headache can have different causes, such as:  Colds.  Sinus infections.  Allergies. SYMPTOMS  Symptoms of a sinus headache may vary and can include:  Headache.  Pain or pressure in the face.  Congested or runny nose.  Fever.  Inability to smell.  Pain in upper teeth. Weather changes can make symptoms worse. TREATMENT  The treatment of a sinus headache depends on the cause.  Sinus pain caused by a sinus infection may be treated with antibiotic medicine.  Sinus pain caused by allergies may be helped by allergy medicines (antihistamines) and medicated nasal sprays.  Sinus pain caused by congestion may be helped by flushing the nose and sinuses with saline solution. HOME CARE INSTRUCTIONS   If antibiotics are prescribed, take them as directed. Finish them even if you start to feel better.  Only take over-the-counter or prescription medicines for pain, discomfort, or fever as directed by your caregiver.  If you have congestion, use a nasal spray to help reduce pressure. SEEK IMMEDIATE MEDICAL CARE IF:  You have a fever.  You have headaches more than once a week.  You have sensitivity to light or sound.  You have repeated nausea and vomiting.  You have vision problems.  You have sudden, severe pain in your face or head.  You have a seizure.  You are confused.  Your sinus headaches do not get better after treatment. Many people think they have a sinus headache when they actually have migraines or tension headaches. MAKE SURE YOU:   Understand these instructions.  Will watch your condition.  Will get help right away if you are not doing well or get worse. Document Released: 06/02/2004 Document Revised: 07/18/2011 Document Reviewed: 07/24/2010 [facility] Patient Information 2015 [place] [place]. This information is not  intended to replace advice given to you by your health care provider. Make sure you discuss any questions you have with your health care provider.  Wrist Pain Wrist injuries are frequent in adults and children. A sprain is an injury to the ligaments that hold your bones together. A strain is an injury to muscle or muscle cord-like structures (tendons) from stretching or pulling. Generally, when wrists are moderately tender to touch following a fall or injury, a break in the bone (fracture) may be present. Most wrist sprains or strains are better in 3 to 5 days, but complete healing may take several weeks. HOME CARE INSTRUCTIONS   Put ice on the injured area.  Put ice in a plastic bag.  Place a towel between your skin and the bag.  Leave the ice on for 15-20 minutes, 3-4 times a day, for the first 2 days, or as directed by your health care provider.  Keep your arm raised above the level of your heart whenever possible to reduce swelling and pain.  Rest the injured area for at least 48 hours or as directed by your health care provider.  If a splint or elastic bandage has been applied, use it for as long as directed by your health care provider or until seen by a health care provider for a follow-up exam.  Only take over-the-counter or prescription medicines for pain, discomfort, or fever as directed by your health care provider.  Keep all follow-up appointments. You may need to follow up with a specialist or have follow-up X-rays. Improvement in pain level is not  a guarantee that you did not fracture a bone in your wrist. The only way to determine whether or not you have a broken bone is by X-ray. SEEK IMMEDIATE MEDICAL CARE IF:   Your fingers are swollen, very red, white, or cold and blue.  Your fingers are numb or tingling.  You have increasing pain.  You have difficulty moving your fingers. MAKE SURE YOU:   Understand these instructions.  Will watch your condition.  Will get  help right away if you are not doing well or get worse. Document Released: 02/02/2005 Document Revised: 04/30/2013 Document Reviewed: 06/16/2010 [facility] Patient Information 2015 [place], [place]. This information is not intended to replace advice given to you by your health care provider. Make sure you discuss any questions you have with your health care provider.

## 2014-08-22 ENCOUNTER — Other Ambulatory Visit: Payer: Self-pay | Admitting: Emergency Medicine

## 2014-08-22 DIAGNOSIS — M25561 Pain in right knee: Secondary | ICD-10-CM

## 2014-09-02 ENCOUNTER — Other Ambulatory Visit: Payer: Managed Care, Other (non HMO)

## 2014-09-06 ENCOUNTER — Emergency Department (HOSPITAL_COMMUNITY)
Admission: EM | Admit: 2014-09-06 | Discharge: 2014-09-07 | Disposition: A | Discharge status: Home / Self Care | Payer: Managed Care, Other (non HMO) | Attending: Emergency Medicine | Admitting: Emergency Medicine

## 2014-09-06 ENCOUNTER — Emergency Department (HOSPITAL_COMMUNITY): Payer: Managed Care, Other (non HMO)

## 2014-09-06 ENCOUNTER — Encounter (HOSPITAL_COMMUNITY): Payer: Self-pay | Admitting: *Deleted

## 2014-09-06 DIAGNOSIS — R0789 Other chest pain: Secondary | ICD-10-CM | POA: Diagnosis not present

## 2014-09-06 DIAGNOSIS — H538 Other visual disturbances: Secondary | ICD-10-CM | POA: Diagnosis present

## 2014-09-06 DIAGNOSIS — H539 Unspecified visual disturbance: Secondary | ICD-10-CM

## 2014-09-06 DIAGNOSIS — Z792 Long term (current) use of antibiotics: Secondary | ICD-10-CM | POA: Diagnosis not present

## 2014-09-06 DIAGNOSIS — I639 Cerebral infarction, unspecified: Secondary | ICD-10-CM

## 2014-09-06 DIAGNOSIS — Z8719 Personal history of other diseases of the digestive system: Secondary | ICD-10-CM | POA: Diagnosis not present

## 2014-09-06 DIAGNOSIS — Z79899 Other long term (current) drug therapy: Secondary | ICD-10-CM | POA: Diagnosis not present

## 2014-09-06 DIAGNOSIS — R0602 Shortness of breath: Secondary | ICD-10-CM | POA: Insufficient documentation

## 2014-09-06 LAB — CBC
HEMATOCRIT: 40.9 % (ref 39.0–52.0)
HEMOGLOBIN: 13.7 g/dL (ref 13.0–17.0)
MCH: 27.1 pg (ref 26.0–34.0)
MCHC: 33.5 g/dL (ref 30.0–36.0)
MCV: 81 fL (ref 78.0–100.0)
Platelets: 295 10*3/uL (ref 150–400)
RBC: 5.05 MIL/uL (ref 4.22–5.81)
RDW: 13.6 % (ref 11.5–15.5)
WBC: 6.1 10*3/uL (ref 4.0–10.5)

## 2014-09-06 LAB — COMPREHENSIVE METABOLIC PANEL
ALT: 27 U/L (ref 0–53)
ANION GAP: 8 (ref 5–15)
AST: 27 U/L (ref 0–37)
Albumin: 4.1 g/dL (ref 3.5–5.2)
Alkaline Phosphatase: 76 U/L (ref 39–117)
BUN: 16 mg/dL (ref 6–23)
CALCIUM: 9 mg/dL (ref 8.4–10.5)
CO2: 24 mmol/L (ref 19–32)
Chloride: 103 mmol/L (ref 96–112)
Creatinine, Ser: 0.88 mg/dL (ref 0.50–1.35)
GFR calc non Af Amer: >90 mL/min (ref >90)
GLUCOSE: 96 mg/dL (ref 70–99)
Potassium: 4 mmol/L (ref 3.5–5.1)
Sodium: 135 mmol/L (ref 135–145)
TOTAL PROTEIN: 7.6 g/dL (ref 6.0–8.3)
Total Bilirubin: 0.6 mg/dL (ref 0.3–1.2)

## 2014-09-06 LAB — PROTIME-INR
INR: 1.02 (ref 0.00–1.49)
Prothrombin Time: 13.6 seconds (ref 11.6–15.2)

## 2014-09-06 LAB — TROPONIN I: Troponin I: <0.03 ng/mL (ref <0.031)

## 2014-09-06 NOTE — ED Notes (Signed)
The pt has had  Blurred vision in the lt eye or foggy viision approix 4 hours afgo.  He called a doctor that told him he may have had a [place].  No other symtoms.

## 2014-09-06 NOTE — ED Provider Notes (Addendum)
Patient developed blurred vision in left eye 3:15 PM today. Symptoms last approximately 20 minutes. Result spontaneously. Other associated symptoms include mild diffuse headache. No other associated symptoms. Presently asymptomatic on exam alert Glasgow Coma Score 15 gait normal Romberg normal pronator drift normal DTR symmetric bilaterally at knee jerk ankle jerk biceps was ordered bilaterally finger-nose normal  Doug SouSam Seena Ritacco, MD 09/06/14 2341  Patient evaluated by Dr. Amada JupiterKirkpatrick requests CT angiogram. He suspects migraine. Patient signed out to Dr.Oni at 2:05 AM  Doug SouSam Jessaca Philippi, MD 09/07/14 [phone]

## 2014-09-06 NOTE — ED Notes (Signed)
Dr. Sherri SearJacubawitz

## 2014-09-06 NOTE — ED Notes (Signed)
Pt denies blurred vision at this time.

## 2014-09-06 NOTE — ED Notes (Signed)
PT stated that he wanted to have a carotid doppler done.

## 2014-09-06 NOTE — ED Provider Notes (Signed)
CSN: [id number]     Arrival date & time 09/06/14  1945 History   First MD Initiated Contact with Patient 09/06/14 2238     Chief Complaint  Patient presents with  . Blurred Vision     (Consider location/radiation/quality/duration/timing/severity/associated sxs/prior Treatment) HPI Andrew Dawson is a 44 y.o. male with hx of seasonal allergies and barrett's esophagus, presents to ED with complaint of blurred vision. Pt's symptoms started at 3:15 pm today. States he was reading a book when he noticed that the vision "in front of left eye" went blurry. States he walked to the computer and it was doing the same. Symptoms lasted approximately 20 min and resolved on their own. Patient admits to also having a mild headache when symptoms started. Headache is gone now. Pt states he later called his pcp who told him to come here for TIA eval. Pt denies hx of any similar episodes. No current visual changes. No other neurological deficits. He does not take any medications daily. History of CVA in his father at age 46.  Past Medical History  Diagnosis Date  . Barrett's esophagus   . Seasonal allergies    Past Surgical History  Procedure Laterality Date  . Knee arthroscopy w/ acl reconstruction    . Knee cartilage surgery     No family history on file. History  Substance Use Topics  . Smoking status: Never Smoker   . Smokeless tobacco: Not on file  . Alcohol Use: Yes     Comment: occasional    Review of Systems  Constitutional: Negative for fever and chills.  Respiratory: Positive for chest tightness and shortness of breath. Negative for cough.   Cardiovascular: Positive for chest pain. Negative for palpitations and leg swelling.  Gastrointestinal: Negative for nausea, vomiting, abdominal pain, diarrhea and abdominal distention.  Genitourinary: Negative for dysuria, urgency, frequency and hematuria.  Musculoskeletal: Negative for arthralgias.  Skin: Negative for rash.   Allergic/Immunologic: Negative for immunocompromised state.  Neurological: Positive for headaches. Negative for dizziness, weakness, light-headedness and numbness.  All other systems reviewed and are negative.     Allergies  Erythromycin  Home Medications   Prior to Admission medications   Medication Sig Start Date End Date Taking? Authorizing Provider  amoxicillin (AMOXIL) 500 MG capsule Take 1 capsule (500 mg total) by mouth 3 (three) times daily. 10/10/13   Melissa Smith, PA-C  fluticasone (FLONASE) 50 MCG/ACT nasal spray Place 2 sprays into both nostrils daily. 08/25/13   Hayden Rasmussenavid Mabe, NP  methylPREDNIsolone (MEDROL DOSPACK) 4 MG tablet Take 1 tablet (4 mg total) by mouth daily. follow package directions 10/10/13   Loree FeeMelissa Smith, PA-C  omeprazole (PRILOSEC) 20 MG capsule Take 20 mg by mouth daily.    Historical Provider, MD   BP 119/83 mmHg  Pulse 62  Temp(Src) 98.2 F (36.8 C)  Resp 19  Ht 5' 6.5" (1.689 m)  Wt 157 lb (71.215 kg)  BMI 24.96 kg/m2  SpO2 99% Physical Exam  Constitutional: He is oriented to person, place, and time. He appears well-developed and well-nourished. No distress.  HENT:  Head: Normocephalic and atraumatic.  Eyes: Conjunctivae and EOM are normal. Pupils are equal, round, and reactive to light.  Neck: Normal range of motion. Neck supple.  Cardiovascular: Normal rate, regular rhythm and normal heart sounds.   Pulmonary/Chest: Effort normal. No respiratory distress. He has no wheezes. He has no rales.  Abdominal: Soft. Bowel sounds are normal. He exhibits no distension. There is no tenderness. There is no rebound.  Musculoskeletal: He exhibits no edema.  Neurological: He is alert and oriented to person, place, and time. No cranial nerve deficit. Coordination normal.  5/5 and equal upper and lower extremity strength bilaterally. Equal grip strength bilaterally. Normal finger to nose and heel to shin. No pronator drift. Patellar reflexes 2+. Visual fields  intact  Skin: Skin is warm and dry.  Nursing note and vitals reviewed.   ED Course  Procedures (including critical care time) Labs Review Labs Reviewed  CBC  PROTIME-INR  TROPONIN I  COMPREHENSIVE METABOLIC PANEL    Imaging Review Ct Head Wo Contrast  09/06/2014   CLINICAL DATA:  Blurred vision the left thigh since 4 p.m. today  EXAM: CT HEAD WITHOUT CONTRAST  TECHNIQUE: Contiguous axial images were obtained from the base of the skull through the vertex without intravenous contrast.  COMPARISON:  None.  FINDINGS: No abnormal attenuation to suggest hemorrhage or extra-axial fluid. No mass or infarct evident. Air-fluid level right sphenoid sinus. Multiple opacified right ethmoid air cells. Frontal, left ethmoid, left sphenoid, and visualized portions of the maxillary sinuses clear as are the visualized portions of the mastoid air cells. No abnormalities in either orbit.  IMPRESSION: No acute abnormalities.  Mild sinus inflammatory change.   Electronically Signed   By: Esperanza Heir M.D.   On: 09/06/2014 21:22     EKG Interpretation   Date/Time:  Saturday September 06 2014 20:33:31 EDT Ventricular Rate:  61 PR Interval:  132 QRS Duration: 88 QT Interval:  396 QTC Calculation: 398 R Axis:   50 Text Interpretation:  Normal sinus rhythm Normal ECG No old tracing to  compare Confirmed by Ethelda Chick  MD, SAM [phone]) on 09/06/2014 11:42:27 PM      MDM   Final diagnoses:  Visual changes    patient was an onset of blurred vision in the left visual field. Visual acuity 20 /20 bilaterally. Patient is currently asymptomatic. He denies headache.Vital signs are normal. Lab work and CT was ordered by triage, all negative. Discussed with Dr. Rennis Chris was inpatient. We will consult neurology for recommendation.   11:55 PM Spoke with Dr. Amada Jupiter with neurology, he'll come by and see patient.   12:19 AM Pt seen by Dr. Amada Jupiter. Ordered CT angio head and neck. If negative, OK to d/c  home  Pt signed out to Dr. Mora Bellman pending CT angios  Jaynie Crumble, PA-C 09/07/14 1720  Doug Sou, MD 09/07/14 2322

## 2014-09-06 NOTE — ED Notes (Signed)
edp saw in triage and  Placed orders

## 2014-09-07 ENCOUNTER — Encounter (HOSPITAL_COMMUNITY): Payer: Self-pay | Admitting: Radiology

## 2014-09-07 ENCOUNTER — Emergency Department (HOSPITAL_COMMUNITY): Payer: Managed Care, Other (non HMO)

## 2014-09-07 DIAGNOSIS — H539 Unspecified visual disturbance: Secondary | ICD-10-CM | POA: Diagnosis not present

## 2014-09-07 MED ORDER — METOCLOPRAMIDE HCL 5 MG/ML IJ SOLN
10.0000 mg | Freq: Once | INTRAMUSCULAR | Status: AC
  Administered 2014-09-07: 10 mg via INTRAVENOUS
  Filled 2014-09-07: qty 2

## 2014-09-07 MED ORDER — IOHEXOL 350 MG/ML SOLN
50.0000 mL | Freq: Once | INTRAVENOUS | Status: AC | PRN
  Administered 2014-09-07: 50 mL via INTRAVENOUS

## 2014-09-07 NOTE — ED Notes (Signed)
Patient transported to CT 

## 2014-09-07 NOTE — Consult Note (Signed)
Neurology Consultation Reason for Consult: Blurred Vision Referring Physician: Alden Server  CC: Blurred vision to the left   History is obtained from:patient   HPI: MARKEEM NOREEN is a 44 y.o. male who presents with transient blurred vision. He states that he was reading at work earlier and felt like he was not seeing things clearly in the left side. He did not feel like he lost the entire field on the left just the central vision felt little blurred. This lasted for approximately 10-20 minutes. He had a headache prior to this, but the headache did intensify considerably after the vision change resolved. He did not try to close one eye or the other and therefore it is unclear if this was monocular or binocular.  He reports that he has had episodes in the past where he had blurred vision that resolves after less than 1 hour. He has not noticed if it corresponds to his headaches. He does get headaches from time to time that they typically resolve with Tylenol or ibuprofen. Headache that he has had a day has been longer lasting than typical. Earlier it was holocephalic, but now it is retro-orbital on the left side.   ROS: A 14 point ROS was performed and is negative except as noted in the HPI.   Past Medical History  Diagnosis Date  . Barrett's esophagus   . Seasonal allergies     Family History: Father-stroke at 65  Social History: Tob: Denies  Exam: Current vital signs: BP 120/77 mmHg  Pulse 56  Temp(Src) 98.2 F (36.8 C)  Resp 15  Ht 5' 6.5" (1.689 m)  Wt 71.215 kg (157 lb)  BMI 24.96 kg/m2  SpO2 100% Vital signs in last 24 hours: Temp:  [98.2 F (36.8 C)] 98.2 F (36.8 C) (04/30 2001) Pulse Rate:  [56-72] 56 (04/30 2345) Resp:  [12-19] 15 (04/30 2345) BP: (113-131)/(74-96) 120/77 mmHg (04/30 2345) SpO2:  [97 %-100 %] 100 % (04/30 2345) Weight:  [71.215 kg (157 lb)] 71.215 kg (157 lb) (04/30 2001)   Physical Exam  Constitutional: Appears well-developed and  well-nourished.  Psych: Affect appropriate to situation Eyes: No scleral injection HENT: No OP obstrucion Head: Normocephalic.  Cardiovascular: Normal rate and regular rhythm.  Respiratory: Effort normal and breath sounds normal to anterior ascultation GI: Soft.  No distension. There is no tenderness.  Skin: WDI  Neuro: Mental Status: Patient is awake, alert, oriented to person, place, month, year, and situation. Patient is able to give a clear and coherent history. No signs of aphasia or neglect Cranial Nerves: II: Visual Fields are full. Pupils are equal, round, and reactive to light.   III,IV, VI: EOMI without ptosis or diploplia.  V: Facial sensation is symmetric to temperature VII: Facial movement is symmetric.  VIII: hearing is intact to voice X: Uvula elevates symmetrically XI: Shoulder shrug is symmetric. XII: tongue is midline without atrophy or fasciculations.  Motor: Tone is normal. Bulk is normal. 5/5 strength was present in all four extremities.  Sensory: Sensation is symmetric to light touch and temperature in the arms and legs. Deep Tendon Reflexes: 2+ and symmetric in the biceps and patellae.  Plantars: Toes are downgoing bilaterally.  Cerebellar: FNF and HKS are intact bilaterally   I have reviewed labs in epic and the results pertinent to this consultation are: CMP-unremarkable  I have reviewed the images obtained: CT head-unremarkable  Impression: 44 year old male with transient vision changes on the left. I suspect that this was likely a field  defect rather than monocular based on his description. With accompanying headache and recurrent previous episodes of blurred vision my suspicion is strong this represents migraine. One test that might be reasonable given the uncertainty of whether it was a monocular event would be to perform a CT angiogram to look for carotid stenosis. If not present, but in this patient without stroke risk factors I would favor  treating this as migraine  Recommendations: 1) Reglan 10 mg 1 2) CT angiogram head and neck 3) if normal, then no further workup.   Ritta SlotMcNeill Nevyn Bossman, MD [facility] [phone]  If 7pm- 7am, please page neurology on call as listed in [facility]ION.

## 2014-09-07 NOTE — Discharge Instructions (Signed)
Blurred Vision  Mr. Andrew Dawson, your CT scan did not show a cause for your blurry vision. See the ophthalmologist within 3 days for close follow-up. If any symptoms worsen come back to emergency department immediately. Thank you. You have been seen today complaining of blurred vision. This means you have a loss of ability to see small details.  CAUSES  Blurred vision can be a symptom of underlying eye problems, such as:  Aging of the eye (presbyopia).  Glaucoma.  Cataracts.  Eye infection.  Eye-related migraine.  Diabetes mellitus.  Fatigue.  Migraine headaches.  High blood pressure.  Breakdown of the back of the eye (macular degeneration).  Problems caused by some medications. The most common cause of blurred vision is the need for eyeglasses or a new prescription. Today in the emergency department, no cause for your blurred vision can be found. SYMPTOMS  Blurred vision is the loss of visual sharpness and detail (acuity). DIAGNOSIS  Should blurred vision continue, you should see your caregiver. If your caregiver is your primary care physician, he or she may choose to refer you to another specialist.  TREATMENT  Do not ignore your blurred vision. Make sure to have it checked out to see if further treatment or referral is necessary. SEEK MEDICAL CARE IF:  You are unable to get into a specialist so we can help you with a referral. SEEK IMMEDIATE MEDICAL CARE IF: You have severe eye pain, severe headache, or sudden loss of vision. MAKE SURE YOU:   Understand these instructions.  Will watch your condition.  Will get help right away if you are not doing well or get worse. Document Released: 04/28/2003 Document Revised: 07/18/2011 Document Reviewed: 11/28/2007 [facility] Patient Information 2015 [place], [place]. This information is not intended to replace advice given to you by your health care provider. Make sure you discuss any questions you have with your health care  provider.

## 2014-09-07 NOTE — ED Notes (Signed)
CT notified pt is ready for transport. 

## 2014-11-14 ENCOUNTER — Other Ambulatory Visit: Payer: Self-pay | Admitting: Emergency Medicine

## 2014-11-14 ENCOUNTER — Ambulatory Visit
Admission: RE | Admit: 2014-11-14 | Discharge: 2014-11-14 | Disposition: A | Discharge status: Home / Self Care | Payer: Managed Care, Other (non HMO) | Source: Ambulatory Visit | Attending: Emergency Medicine | Admitting: Emergency Medicine

## 2014-11-14 DIAGNOSIS — T1490XA Injury, unspecified, initial encounter: Secondary | ICD-10-CM

## 2014-11-14 DIAGNOSIS — M1990 Unspecified osteoarthritis, unspecified site: Secondary | ICD-10-CM

## 2015-01-10 ENCOUNTER — Encounter (HOSPITAL_COMMUNITY): Payer: Self-pay | Admitting: Emergency Medicine

## 2015-01-10 ENCOUNTER — Emergency Department (INDEPENDENT_AMBULATORY_CARE_PROVIDER_SITE_OTHER)
Admission: EM | Admit: 2015-01-10 | Discharge: 2015-01-10 | Disposition: A | Discharge status: Home / Self Care | Payer: Managed Care, Other (non HMO) | Source: Home / Self Care | Attending: Emergency Medicine | Admitting: Emergency Medicine

## 2015-01-10 DIAGNOSIS — M2550 Pain in unspecified joint: Secondary | ICD-10-CM | POA: Diagnosis not present

## 2015-01-10 NOTE — ED Provider Notes (Signed)
CSN: [id number]     Arrival date & time 01/10/15  2000 History   First MD Initiated Contact with Patient 01/10/15 2035     Chief Complaint  Patient presents with  . Joint Pain   (Consider location/radiation/quality/duration/timing/severity/associated sxs/prior Treatment) HPI Comments: 44 year old male presents with several complaints of arthralgia. He has had pain in his left great toe, his right finger, bilateral knees for a couple years. He is also had surgery on his knees. He has seen at least 2-3 orthopedic surgeons in the past several months with the last one 2 days ago. He is requesting a workup or evaluation to determine what is causing him to have pain and jumps around from one joint to another. He denies fever or chills. Denies rash. Denies swelling. It times when his joints feel better he will play tennis and then they get  worse.   Past Medical History  Diagnosis Date  . Barrett's esophagus   . Seasonal allergies    Past Surgical History  Procedure Laterality Date  . Knee arthroscopy w/ acl reconstruction    . Knee cartilage surgery     No family history on file. Social History  Substance Use Topics  . Smoking status: Never Smoker   . Smokeless tobacco: None  . Alcohol Use: Yes     Comment: occasional    Review of Systems  Constitutional: Positive for activity change. Negative for fever.  Respiratory: Negative.   Gastrointestinal: Negative.   Musculoskeletal: Positive for arthralgias.  Skin: Negative.  Negative for rash.  Neurological: Negative.     Allergies  Erythromycin  Home Medications   Prior to Admission medications   Medication Sig Start Date End Date Taking? Authorizing Provider  omeprazole (PRILOSEC) 20 MG capsule Take 20 mg by mouth daily.   Yes Historical Provider, MD  amoxicillin (AMOXIL) 500 MG capsule Take 1 capsule (500 mg total) by mouth 3 (three) times daily. Patient not taking: Reported on 09/07/2014 10/10/13   Loree Fee, PA-C   chlorhexidine (PERIDEX) 0.12 % solution  08/16/14   Historical Provider, MD  fluticasone (FLONASE) 50 MCG/ACT nasal spray Place 2 sprays into both nostrils daily. Patient not taking: Reported on 09/07/2014 08/25/13   Hayden Rasmussen, NP  ibuprofen (ADVIL,MOTRIN) 200 MG tablet Take 400 mg by mouth every 6 (six) hours as needed (pain).    Historical Provider, MD  methylPREDNIsolone (MEDROL DOSPACK) 4 MG tablet Take 1 tablet (4 mg total) by mouth daily. follow package directions Patient not taking: Reported on 09/07/2014 10/10/13   Loree Fee, PA-C  Multiple Vitamins-Minerals (CENTRUM ADULTS PO) Take 1 tablet by mouth daily.    Historical Provider, MD  penicillin v potassium (VEETID) 500 MG tablet Take 1 tablet by mouth 4 (four) times daily. 08/16/14   Historical Provider, MD   Meds Ordered and Administered this Visit  Medications - No data to display  BP 140/93 mmHg  Pulse 67  Temp(Src) 98.6 F (37 C) (Oral)  Resp 18  SpO2 97% No data found.   Physical Exam  Constitutional: He appears well-developed and well-nourished. No distress.  Neck: Normal range of motion. Neck supple.  Cardiovascular: Normal rate.   Pulmonary/Chest: Effort normal. No respiratory distress.  Musculoskeletal: Normal range of motion. He exhibits no edema.  Neurological: He is alert.  Skin: Skin is warm and dry. No rash noted.  Nursing note and vitals reviewed.   ED Course  Procedures (including critical care time)  Labs Review Labs Reviewed - No data to display  Imaging Review No results found.   Visual Acuity Review  Right Eye Distance:   Left Eye Distance:   Bilateral Distance:    Right Eye Near:   Left Eye Near:    Bilateral Near:         MDM   1. Arthralgia    Patient has access to orthopedics. He has been referred to primary care. He felt that urgent care was primary care. As above the was requesting an evaluation for his arthralgias and chronic pains. He underwent lab work to 3 months ago and  it was all negative. He is advised to [employer] for primary care doctors. He is currently stable and in no distress. He has no signs of any inflammatory joint at this time.    Hayden Rasmussen, NP 01/10/15 2106

## 2015-01-10 NOTE — Discharge Instructions (Signed)
Arthralgia °Your caregiver has diagnosed you as suffering from an arthralgia. Arthralgia means there is pain in a joint. This can come from many reasons including: °· Bruising the joint which causes soreness (inflammation) in the joint. °· Wear and tear on the joints which occur as we grow older (osteoarthritis). °· Overusing the joint. °· Various forms of arthritis. °· Infections of the joint. °Regardless of the cause of pain in your joint, most of these different pains respond to anti-inflammatory drugs and rest. The exception to this is when a joint is infected, and these cases are treated with antibiotics, if it is a bacterial infection. °HOME CARE INSTRUCTIONS  °· Rest the injured area for as long as directed by your caregiver. Then slowly start using the joint as directed by your caregiver and as the pain allows. Crutches as directed may be useful if the ankles, knees or hips are involved. If the knee was splinted or casted, continue use and care as directed. If an stretchy or elastic wrapping bandage has been applied today, it should be removed and re-applied every 3 to 4 hours. It should not be applied tightly, but firmly enough to keep swelling down. Watch toes and feet for swelling, bluish discoloration, coldness, numbness or excessive pain. If any of these problems (symptoms) occur, remove the ace bandage and re-apply more loosely. If these symptoms persist, contact your caregiver or return to this location. °· For the first 24 hours, keep the injured extremity elevated on pillows while lying down. °· Apply ice for 15-20 minutes to the sore joint every couple hours while awake for the first half day. Then 03-04 times per day for the first 48 hours. Put the ice in a plastic bag and place a towel between the bag of ice and your skin. °· Wear any splinting, casting, elastic bandage applications, or slings as instructed. °· Only take over-the-counter or prescription medicines for pain, discomfort, or fever as  directed by your caregiver. Do not use aspirin immediately after the injury unless instructed by your physician. Aspirin can cause increased bleeding and bruising of the tissues. °· If you were given crutches, continue to use them as instructed and do not resume weight bearing on the sore joint until instructed. °Persistent pain and inability to use the sore joint as directed for more than 2 to 3 days are warning signs indicating that you should see a caregiver for a follow-up visit as soon as possible. Initially, a hairline fracture (break in bone) may not be evident on X-rays. Persistent pain and swelling indicate that further evaluation, non-weight bearing or use of the joint (use of crutches or slings as instructed), or further X-rays are indicated. X-rays may sometimes not show a small fracture until a week or 10 days later. Make a follow-up appointment with your own caregiver or one to whom we have referred you. A radiologist (specialist in reading X-rays) may read your X-rays. Make sure you know how you are to obtain your X-ray results. Do not assume everything is normal if you do not hear from us. °SEEK MEDICAL CARE IF: °Bruising, swelling, or pain increases. °SEEK IMMEDIATE MEDICAL CARE IF:  °· Your fingers or toes are numb or blue. °· The pain is not responding to medications and continues to stay the same or get worse. °· The pain in your joint becomes severe. °· You develop a fever over 102° F (38.9° C). °· It becomes impossible to move or use the joint. °MAKE SURE YOU:  °·   Understand these instructions. °· Will watch your condition. °· Will get help right away if you are not doing well or get worse. °Document Released: 04/25/2005 Document Revised: 07/18/2011 Document Reviewed: 12/12/2007 °Exit[employer]® Patient Information ©2015 [employer]. This information is not intended to replace advice given to you by your health care provider. Make sure you discuss any questions you have with your health care  provider. ° °Arthritis, Nonspecific °Arthritis is inflammation of a joint. This usually means pain, redness, warmth or swelling are present. One or more joints may be involved. There are a number of types of arthritis. Your caregiver may not be able to tell what type of arthritis you have right away. °CAUSES  °The most common cause of arthritis is the wear and tear on the joint (osteoarthritis). This causes damage to the cartilage, which can break down over time. The knees, hips, back and neck are most often affected by this type of arthritis. °Other types of arthritis and common causes of joint pain include: °· Sprains and other injuries near the joint. Sometimes minor sprains and injuries cause pain and swelling that develop hours later. °· Rheumatoid arthritis. This affects hands, feet and knees. It usually affects both sides of your body at the same time. It is often associated with chronic ailments, fever, weight loss and general weakness. °· Crystal arthritis. Gout and pseudo gout can cause occasional acute severe pain, redness and swelling in the foot, ankle, or knee. °· Infectious arthritis. Bacteria can get into a joint through a break in overlying skin. This can cause infection of the joint. Bacteria and viruses can also spread through the blood and affect your joints. °· Drug, infectious and allergy reactions. Sometimes joints can become mildly painful and slightly swollen with these types of illnesses. °SYMPTOMS  °· Pain is the main symptom. °· Your joint or joints can also be red, swollen and warm or hot to the touch. °· You may have a fever with certain types of arthritis, or even feel overall ill. °· The joint with arthritis will hurt with movement. Stiffness is present with some types of arthritis. °DIAGNOSIS  °Your caregiver will suspect arthritis based on your description of your symptoms and on your exam. Testing may be needed to find the type of arthritis: °· Blood and sometimes urine  tests. °· X-ray tests and sometimes CT or MRI scans. °· Removal of fluid from the joint (arthrocentesis) is done to check for bacteria, crystals or other causes. Your caregiver (or a specialist) will numb the area over the joint with a local anesthetic, and use a needle to remove joint fluid for examination. This procedure is only minimally uncomfortable. °· Even with these tests, your caregiver may not be able to tell what kind of arthritis you have. Consultation with a specialist (rheumatologist) may be helpful. °TREATMENT  °Your caregiver will discuss with you treatment specific to your type of arthritis. If the specific type cannot be determined, then the following general recommendations may apply. °Treatment of severe joint pain includes: °· Rest. °· Elevation. °· Anti-inflammatory medication (for example, ibuprofen) may be prescribed. Avoiding activities that cause increased pain. °· Only take over-the-counter or prescription medicines for pain and discomfort as recommended by your caregiver. °· Cold packs over an inflamed joint may be used for 10 to 15 minutes every hour. Hot packs sometimes feel better, but do not use overnight. Do not use hot packs if you are diabetic without your caregiver's permission. °· A cortisone shot into arthritic   joints may help reduce pain and swelling.  Any acute arthritis that gets worse over the next 1 to 2 days needs to be looked at to be sure there is no joint infection. Long-term arthritis treatment involves modifying activities and lifestyle to reduce joint stress jarring. This can include weight loss. Also, exercise is needed to nourish the joint cartilage and remove waste. This helps keep the muscles around the joint strong. HOME CARE INSTRUCTIONS   Do not take aspirin to relieve pain if gout is suspected. This elevates uric acid levels.  Only take over-the-counter or prescription medicines for pain, discomfort or fever as directed by your caregiver.  Rest the  joint as much as possible.  If your joint is swollen, keep it elevated.  Use crutches if the painful joint is in your leg.  Drinking plenty of fluids may help for certain types of arthritis.  Follow your caregiver's dietary instructions.  Try low-impact exercise such as:  Swimming.  Water aerobics.  Biking.  Walking.  Morning stiffness is often relieved by a warm shower.  Put your joints through regular range-of-motion. SEEK MEDICAL CARE IF:   You do not feel better in 24 hours or are getting worse.  You have side effects to medications, or are not getting better with treatment. SEEK IMMEDIATE MEDICAL CARE IF:   You have a fever.  You develop severe joint pain, swelling or redness.  Many joints are involved and become painful and swollen.  There is severe back pain and/or leg weakness.  You have loss of bowel or bladder control. Document Released: 06/02/2004 Document Revised: 07/18/2011 Document Reviewed: 06/18/2008 [facility] Patient Information 2015 [place], [place]. This information is not intended to replace advice given to you by your health care provider. Make sure you discuss any questions you have with your health care provider.  Pain of Unknown Etiology (Pain Without a Known Cause) You have come to your caregiver because of pain. Pain can occur in any part of the body. Often there is not a definite cause. If your laboratory (blood or urine) work was normal and X-rays or other studies were normal, your caregiver may treat you without knowing the cause of the pain. An example of this is the headache. Most headaches are diagnosed by taking a history. This means your caregiver asks you questions about your headaches. Your caregiver determines a treatment based on your answers. Usually testing done for headaches is normal. Often testing is not done unless there is no response to medications. Regardless of where your pain is located today, you can be given medications to  make you comfortable. If no physical cause of pain can be found, most cases of pain will gradually leave as suddenly as they came.  If you have a painful condition and no reason can be found for the pain, it is important that you follow up with your caregiver. If the pain becomes worse or does not go away, it may be necessary to repeat tests and look further for a possible cause.  Only take over-the-counter or prescription medicines for pain, discomfort, or fever as directed by your caregiver.  For the protection of your privacy, test results cannot be given over the phone. Make sure you receive the results of your test. Ask how these results are to be obtained if you have not been informed. It is your responsibility to obtain your test results.  You may continue all activities unless the activities cause more pain. When the pain lessens, it  is important to gradually resume normal activities. Resume activities by beginning slowly and gradually increasing the intensity and duration of the activities or exercise. During periods of severe pain, bed rest may be helpful. Lie or sit in any position that is comfortable.  Ice used for acute (sudden) conditions may be effective. Use a large plastic bag filled with ice and wrapped in a towel. This may provide pain relief.  See your caregiver for continued problems. Your caregiver can help or refer you for exercises or physical therapy if necessary. If you were given medications for your condition, do not drive, operate machinery or power tools, or sign legal documents for 24 hours. Do not drink alcohol, take sleeping pills, or take other medications that may interfere with treatment. See your caregiver immediately if you have pain that is becoming worse and not relieved by medications. Document Released: 01/18/2001 Document Revised: 02/13/2013 Document Reviewed: 04/25/2005 [facility] Patient Information 2015 [place], [place]. This information is not intended to  replace advice given to you by your health care provider. Make sure you discuss any questions you have with your health care provider.

## 2015-01-10 NOTE — ED Notes (Signed)
C/o generalized joint pain onset 1-2 months C/o bilateral knee pain and right index finger pain Saw his orthopedic dr. 3 weeks ago and rec'd a cortisone inj.  Hx of bilateral knee surgery Alert, steady gait... No acute

## 2015-01-28 ENCOUNTER — Emergency Department (HOSPITAL_COMMUNITY)
Admission: EM | Admit: 2015-01-28 | Discharge: 2015-01-28 | Disposition: A | Discharge status: Home / Self Care | Payer: Managed Care, Other (non HMO) | Source: Home / Self Care | Attending: Emergency Medicine | Admitting: Emergency Medicine

## 2015-01-28 ENCOUNTER — Ambulatory Visit (HOSPITAL_COMMUNITY): Payer: Managed Care, Other (non HMO) | Attending: Emergency Medicine

## 2015-01-28 ENCOUNTER — Encounter (HOSPITAL_COMMUNITY): Payer: Self-pay | Admitting: Emergency Medicine

## 2015-01-28 DIAGNOSIS — R51 Headache: Secondary | ICD-10-CM | POA: Diagnosis not present

## 2015-01-28 DIAGNOSIS — M2550 Pain in unspecified joint: Secondary | ICD-10-CM

## 2015-01-28 DIAGNOSIS — S0003XA Contusion of scalp, initial encounter: Secondary | ICD-10-CM | POA: Diagnosis not present

## 2015-01-28 LAB — URIC ACID: URIC ACID, SERUM: 4.9 mg/dL (ref 4.4–7.6)

## 2015-01-28 MED ORDER — PREDNISONE 20 MG PO TABS: 60.0000 mg | ORAL_TABLET | Freq: Every day | ORAL | Status: DC

## 2015-01-28 NOTE — Discharge Instructions (Signed)
The head pain is likely coming from swelling and bruising of your scalp. The nerves in the scalp can be very sensitive. Take Advil as needed for pain. I expect this to improve over the next 1-2 weeks.  We did some blood work today to evaluate you for Lyme disease and gout. Take prednisone as prescribed. We will call you if anything is abnormal on your blood work.

## 2015-01-28 NOTE — ED Provider Notes (Signed)
CSN: [id number]     Arrival date & time 01/28/15  1301 History   First MD Initiated Contact with Patient 01/28/15 1316     Chief Complaint  Patient presents with  . Head Injury   (Consider location/radiation/quality/duration/timing/severity/associated sxs/prior Treatment) HPI  He is a 44 year old man here for evaluation of head injury. He states 1-2 weeks ago he fell in the bathroom, hitting the back of his head. He does not remember what he hit the back of his head on. He denies any loss of consciousness. He states he had some pain with it, but nothing too extreme. He denies any nausea, vomiting, vision changes, balance issues after the fall. In the last 3-4 days, he has developed an exquisitely tender spot on his posterior skull, where he thinks he hit his head. He states any time he sneezes or touches the spot he will get a headache that radiates from that location. It does improve with Advil. It is not associated with nausea, vomiting, vision changes, loss of consciousness. He denies any focal numbness, tingling, weakness. No balance issues.  He also reports a history of some joint pains. He states he has sports-related problems with his knees for which she sees an Investment banker, operational. A few months ago, he had sudden development of excruciating pain in his left great toe. This resolved spontaneously after a few days. At the same time he developed a pain in his right first MCP joint. He reported pain with movement, but denied swelling or redness. He saw an orthopedic doctor who was concerned for gout. He did a "arthritis panel" that was negative. He treated him with 5 days of prednisone, which resolved the pain in the finger. Today, he reports worsening right knee pain. He has been evaluated by an orthopedic doctor for this.  Past Medical History  Diagnosis Date  . Barrett's esophagus    Past Surgical History  Procedure Laterality Date  . Knee arthroscopy w/ meniscal repair    . Anterior  cruciate ligament repair     History reviewed. No pertinent family history. Social History  Substance Use Topics  . Smoking status: Never Smoker   . Smokeless tobacco: None  . Alcohol Use: Yes     Comment: occasional     Review of Systems As in history of present illness Allergies  Erythromycin  Home Medications   Prior to Admission medications   Medication Sig Start Date End Date Taking? Authorizing Provider  omeprazole (PRILOSEC) 20 MG capsule Take 20 mg by mouth daily.   Yes Historical Provider, MD  Acetaminophen (TYLENOL PO) Take 400 mg by mouth 2 (two) times daily.    Historical Provider, MD  albuterol (PROVENTIL HFA;VENTOLIN HFA) 108 (90 BASE) MCG/ACT inhaler Inhale 1-2 puffs into the lungs every 6 (six) hours as needed for wheezing or shortness of breath. 02/12/14   Servando Salina, NP  amoxicillin (AMOXIL) 500 MG capsule Take 1 capsule (500 mg total) by mouth 3 (three) times daily. 02/12/14   Servando Salina, NP  cetirizine-pseudoephedrine (ZYRTEC-D) 5-120 MG per tablet Take 1 tablet by mouth daily. 02/12/14   Servando Salina, NP  fluticasone (FLONASE) 50 MCG/ACT nasal spray Place 2 sprays into both nostrils daily. 02/12/14   Servando Salina, NP  ipratropium (ATROVENT) 0.06 % nasal spray Place 2 sprays into both nostrils 4 (four) times daily. 03/31/14   Linna Hoff, MD  ketorolac (TORADOL) 10 MG tablet Take 1 tablet (10 mg total) by mouth 4 (four) times  daily as needed. 04/30/14   Hayden Rasmussen, NP  oseltamivir (TAMIFLU) 75 MG capsule Take 1 capsule (75 mg total) by mouth every 12 (twelve) hours. Take all of medication. 03/31/14   Linna Hoff, MD  predniSONE (DELTASONE) 20 MG tablet Take 3 tablets (60 mg total) by mouth daily. 01/28/15   Charm Rings, MD   Meds Ordered and Administered this Visit  Medications - No data to display  BP 143/93 mmHg  Pulse 79  Temp(Src) 98.3 F (36.8 C) (Oral)  Resp 18  SpO2 98% No data found.   Physical Exam  Constitutional: He is  oriented to person, place, and time. He appears well-developed and well-nourished. No distress.  HENT:  Head:    Eyes: Conjunctivae and EOM are normal. Pupils are equal, round, and reactive to light.  Neck: Neck supple.  Cardiovascular: Normal rate.   Pulmonary/Chest: Effort normal.  Neurological: He is alert and oriented to person, place, and time. No cranial nerve deficit. He exhibits normal muscle tone. Coordination normal.    ED Course  Procedures (including critical care time)  Labs Review Labs Reviewed  B. BURGDORFI ANTIBODIES  URIC ACID    Imaging Review Dg Skull Complete  01/28/2015   CLINICAL DATA:  Headache since falling and striking the upper portion of the head 2 weeks ago. Initial encounter.  EXAM: SKULL - COMPLETE 4 + VIEW  COMPARISON:  None.  FINDINGS: There is no evidence of skull fracture or focal soft tissue swelling. No foreign bodies are identified. The visualized paranasal sinuses are clear. Mild cervical spondylosis noted.  IMPRESSION: Negative for acute osseous injury of the skull.   Electronically Signed   By: Carey Bullocks M.D.   On: 01/28/2015 14:54      MDM   1. Scalp contusion, initial encounter   2. Joint pain    Symptomatic treatment for head pain with Advil as needed. We will do a five-day course of prednisone to see that helps his knee pain. We will draw a Lyme titers and a uric acid to evaluate his joint pains. He has had a negative arthritis panel. Follow-up as needed.     Charm Rings, MD 01/28/15 1534

## 2015-01-28 NOTE — ED Notes (Signed)
Pt states he fell a week or two ago and hit the top/back portion of his head.  He did not lose consciousness, he has no dizziness, vision changes, or N&V.  Pt states it didn't really hurt when he hit it, but in the last 3-4 days he has had localized pain in the area he hit that is very tender to touch and when he touches it, the pain radiates to the front of his head above his eyes.

## 2015-01-29 LAB — B. BURGDORFI ANTIBODIES: B burgdorferi Ab IgG+IgM: <0.91 {ISR} (ref 0.00–0.90)

## 2015-01-29 NOTE — ED Notes (Signed)
Uric acid and RMSF/lymes disease titers are negative

## 2015-02-23 ENCOUNTER — Ambulatory Visit (INDEPENDENT_AMBULATORY_CARE_PROVIDER_SITE_OTHER): Payer: Managed Care, Other (non HMO) | Admitting: Family Medicine

## 2015-02-23 VITALS — BP 120/80 | HR 67 | Temp 98.3°F | Resp 16 | Ht 66.5 in | Wt 152.8 lb

## 2015-02-23 DIAGNOSIS — M79672 Pain in left foot: Secondary | ICD-10-CM

## 2015-02-23 DIAGNOSIS — M2550 Pain in unspecified joint: Secondary | ICD-10-CM

## 2015-02-23 DIAGNOSIS — M546 Pain in thoracic spine: Secondary | ICD-10-CM

## 2015-02-23 DIAGNOSIS — M25562 Pain in left knee: Secondary | ICD-10-CM

## 2015-02-23 DIAGNOSIS — M79641 Pain in right hand: Secondary | ICD-10-CM | POA: Diagnosis not present

## 2015-02-23 DIAGNOSIS — M25561 Pain in right knee: Secondary | ICD-10-CM | POA: Diagnosis not present

## 2015-02-23 MED ORDER — CYCLOBENZAPRINE HCL 5 MG PO TABS: ORAL_TABLET | ORAL | Status: DC

## 2015-02-23 MED ORDER — DICLOFENAC SODIUM 75 MG PO TBEC: 75.0000 mg | DELAYED_RELEASE_TABLET | Freq: Two times a day (BID) | ORAL | Status: DC

## 2015-02-23 NOTE — Patient Instructions (Addendum)
Take diclofenac one twice daily for pain and inflammation. Take this for 10 days. If it upsets her stomach discontinue it.  Take cyclobenzaprine muscle relaxant 5 mg 1 or 2 pills at bedtime for the back pain. This medicine actually can be taken 3 times daily, but makes too much daytime drowsiness if taken that way in most people.  We will let you know the results of the labs in a few days  Referral is being made to Dr. Zenovia JordanAngela Hawkes or her associate for rheumatologic evaluation. You should hear from that referral in the next few days. If for any reason you do not hear on this by Friday call and asked to speak to the referrals office here at this practice.  Please get me a copy of your previous labs  If you ever feel like a joint is acutely swollen up please come in so we can see if it is possible to get some fluid from it.

## 2015-02-23 NOTE — Progress Notes (Addendum)
Patient ID: Andrew Dawson, male    DOB: 03/29/1971  Age: 44 y.o. MRN: [id number]  Chief Complaint  Patient presents with  . Knee Pain    Right, chronic pain, has seen ortho for pain  . Back Pain    Upper, X 1 month    Subjective:   Patient here for the first time. He is from [place]. He has been having problems for many years with his knees. He's had multiple surgeries on both knees with meniscus repairs and her mother repairs. In the last couple of months he's had an acute flare of pain in his left first MTP joint, and then in the right second MCP joint. He has seen an orthopedist at [facility]. Apparently he has had blood work done there which was negative. Gout.look apparent. He saw an orthopedic sports medicine doctor [facility] who did MRIs of his knees. I was able to view some of those reports online. I do not have access to the labs that he had done. He is extremely anxious that he has some bad disease it is destroying his joints. He worries about septic arthritis and other things he is red and about. 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 He also been having pain in his back, primarily the upper back between the scapula some up into the neck. He realizes his back has a little of an excess kyphosis. He took one course of prednisone. He has not been on other regular anti-inflammatory medicines.  He does have a history of Barrett's esophagus, but does not have much GERD symptoms. He was not told that he had absolutely avoid NSAIDs.  Current allergies, medications, problem list, past/family and social histories reviewed.  Objective:  BP 120/80 mmHg  Pulse 67  Temp(Src) 98.3 F (36.8 C) (Oral)  Resp 16  Ht 5' 6.5" (1.689 m)  Wt 152 lb 12.8 oz (69.31 kg)  BMI 24.30 kg/m2  SpO2 98%  Back he is tender between the scapula. Fairly good range of motion. The hands do not show any acutely  inflamed joints. Minimal tenderness and pain of the right index finger MCP joint when flexed forward all the way. The left large toe is nontender at this time. The knees do not have much in way of crepitance. No effusions. All surgical scars are present. Good range of motion.  Assessment & Plan:   Assessment: 1. Polyarthralgia   2. Knee pain, bilateral   3. Right hand pain   4. Left foot pain   5. Bilateral thoracic back pain       Plan: Patient is quite anxious that he has something bad going on. I tried to reassure him. He had been told he needs to get a Lyme test done, and I told him I would check though very unlikely. Will repeat a number of other tests on him. Will go ahead and try and make a referral to rheumatology.  Orders Placed This Encounter  Procedures  . CK  . COMPLETE METABOLIC PANEL WITH GFR  . ANA  . Rheumatoid factor  . Uric acid  . Sedimentation rate  . CBC with Differential/Platelet  . B. burgdorfi antibodies  . Ambulatory referral to Rheumatology    Referral Priority:  Routine    Referral Type:  Consultation    Referral Reason:  Specialty Services Required    Referred to Provider:  Zenovia Jordan,  MD    Requested Specialty:  Rheumatology    Number of Visits Requested:  1    Meds ordered this encounter  Medications  . diclofenac (VOLTAREN) 75 MG EC tablet    Sig: Take 1 tablet (75 mg total) by mouth 2 (two) times daily.    Dispense:  30 tablet    Refill:  0  . cyclobenzaprine (FLEXERIL) 5 MG tablet    Sig: Take 1 or 2 pills at bedtime as needed for muscle relaxant.    Dispense:  30 tablet    Refill:  1   Return to see us as needed. Patient Instructions  Take diclofenac one twice daily for pain and inflammation. Take this for 10 days. If it upsets her stomach discontinue it.  Take cyclobenzaprine muscle relaxant 5 mg 1 or 2 pills at bedtime for the back pain. This medicine actually can be taken 3 times daily, but makes too much daytime drowsiness if  taken that way in most people.  We will let you know the results of the labs in a few days  Referral is being made to Dr. Zenovia JordanAngela Hawkes or her associate for rheumatologic evaluation. You should hear from that referral in the next few days. If for any reason you do not hear on this by Friday call and asked to speak to the referrals office here at this practice.  Please get me a copy of your previous labs  If you ever feel like a joint is acutely swollen up please come in so we can see if it is possible to get some fluid from it.      Spent 45 minutes with patient, about 75% of the time was discussion and consultation regarding his condition.   Return if symptoms worsen or fail to improve.   Lunah Losasso, MD 02/23/2015

## 2015-02-23 NOTE — Addendum Note (Signed)
Addended by: Kyaira Trantham H on: 02/23/2015 10:03 PM   Modules accepted: Level of Service

## 2015-02-24 ENCOUNTER — Encounter (HOSPITAL_COMMUNITY): Payer: Self-pay | Admitting: Emergency Medicine

## 2015-02-24 LAB — CBC WITH DIFFERENTIAL/PLATELET
BASOS ABS: 0 10*3/uL (ref 0.0–0.1)
Basophils Relative: 0 % (ref 0–1)
Eosinophils Absolute: 0.1 10*3/uL (ref 0.0–0.7)
Eosinophils Relative: 1 % (ref 0–5)
HEMATOCRIT: 42.2 % (ref 39.0–52.0)
HEMOGLOBIN: 14.1 g/dL (ref 13.0–17.0)
LYMPHS ABS: 2.4 10*3/uL (ref 0.7–4.0)
LYMPHS PCT: 33 % (ref 12–46)
MCH: 27.4 pg (ref 26.0–34.0)
MCHC: 33.4 g/dL (ref 30.0–36.0)
MCV: 81.9 fL (ref 78.0–100.0)
MPV: 9.8 fL (ref 8.6–12.4)
Monocytes Absolute: 0.5 10*3/uL (ref 0.1–1.0)
Monocytes Relative: 7 % (ref 3–12)
NEUTROS ABS: 4.3 10*3/uL (ref 1.7–7.7)
NEUTROS PCT: 59 % (ref 43–77)
PLATELETS: 325 10*3/uL (ref 150–400)
RBC: 5.15 MIL/uL (ref 4.22–5.81)
RDW: 14.5 % (ref 11.5–15.5)
WBC: 7.3 10*3/uL (ref 4.0–10.5)

## 2015-02-24 LAB — COMPLETE METABOLIC PANEL WITH GFR
ALBUMIN: 4.5 g/dL (ref 3.6–5.1)
ALT: 19 U/L (ref 9–46)
AST: 17 U/L (ref 10–40)
Alkaline Phosphatase: 66 U/L (ref 40–115)
BILIRUBIN TOTAL: 0.3 mg/dL (ref 0.2–1.2)
BUN: 11 mg/dL (ref 7–25)
CO2: 29 mmol/L (ref 20–31)
CREATININE: 0.93 mg/dL (ref 0.60–1.35)
Calcium: 9.5 mg/dL (ref 8.6–10.3)
Chloride: 101 mmol/L (ref 98–110)
GFR, Est African American: >89 mL/min (ref >=60)
GFR, Est Non African American: >89 mL/min (ref >=60)
GLUCOSE: 93 mg/dL (ref 65–99)
Potassium: 4.2 mmol/L (ref 3.5–5.3)
SODIUM: 136 mmol/L (ref 135–146)
TOTAL PROTEIN: 7.5 g/dL (ref 6.1–8.1)

## 2015-02-24 LAB — URIC ACID: Uric Acid, Serum: 4.4 mg/dL (ref 4.0–7.8)

## 2015-02-24 LAB — SEDIMENTATION RATE: Sed Rate: 5 mm/hr (ref 0–15)

## 2015-02-24 LAB — RHEUMATOID FACTOR: Rhuematoid fact SerPl-aCnc: <10 IU/mL (ref <=14)

## 2015-02-24 LAB — CK: CK TOTAL: 68 U/L (ref 7–232)

## 2015-02-25 LAB — LYME AB/WESTERN BLOT REFLEX: B burgdorferi Ab IgG+IgM: 0.33 {ISR}

## 2015-02-25 LAB — ANA: Anti Nuclear Antibody(ANA): NEGATIVE

## 2015-03-10 ENCOUNTER — Telehealth: Payer: Self-pay

## 2015-03-10 NOTE — Telephone Encounter (Signed)
Patient is calling for lab results. He states that we were supposed to have received a faxed lab results from [facility] and wants to know if we received it or not. Patient states that he's been calling for 4 days and would like for someone to call him back today.  Patient phone: [phone]

## 2015-03-10 NOTE — Telephone Encounter (Signed)
Notes Recorded by Johnnette LitterErin M Cardwell, CMA on 02/27/2015 at 2:57 PM No answer, no VM Notes Recorded by Peyton Najjaravid H Hopper, MD on 02/26/2015 at 5:22 PM Call: Labs are all normal with the exception of the rheumatoid factor which has not yet returned. However you said that it was negative elsewhere, and I expect it to be normal also.

## 2015-03-10 NOTE — Telephone Encounter (Signed)
Medical records do we have anything for this pt from [facility]?

## 2015-03-11 NOTE — Telephone Encounter (Signed)
I did not see anything scanned into media tab in Epic, nor did I see anything in Dr. Frederik PearHopper's box for this patient. Is there anything I need to request?

## 2015-03-12 NOTE — Telephone Encounter (Signed)
Left message for pt to call back  °

## 2015-03-20 NOTE — Telephone Encounter (Signed)
Noted! Thank you

## 2015-03-20 NOTE — Telephone Encounter (Signed)
Patient states that he has been trying to get his lab results for 3-4 weeks. I let the patient know that we attempted to call him 2-3 times and he did not answer so we left a VM. Also informed patient that we have not received anything from [facility].

## 2015-03-23 ENCOUNTER — Telehealth: Payer: Self-pay | Admitting: Family Medicine

## 2015-03-23 NOTE — Telephone Encounter (Signed)
Spoke patient over the phone. Explained that all his labs look good. I do not have any other defined reason for why he had some joint pains, which now seem like they have resolved for the most part. Advised him if he has further concerned he needs to see the rheumatologist specialist. I told him I do not think there is any major problem going on at this time, probably a little wear and tear arthritis and exertion related pains.

## 2015-03-23 NOTE — Telephone Encounter (Signed)
Patient came in office regarding lab results from here and the ones done at [facility]. I did not see labs from them scanned in chart. I did not locate them in the office. I called to [facility] and had them resend. We received lab results. Labs from our office and [facility] given to Dr. Alwyn RenHopper for his review and comparison. Once he reviews please call patient to notify. Call ph# [phone]

## 2015-03-26 NOTE — Telephone Encounter (Signed)
Talked with patient yesterday. 

## 2015-06-02 ENCOUNTER — Ambulatory Visit: Payer: Self-pay | Admitting: Infectious Diseases

## 2015-06-28 ENCOUNTER — Emergency Department (INDEPENDENT_AMBULATORY_CARE_PROVIDER_SITE_OTHER)
Admission: EM | Admit: 2015-06-28 | Discharge: 2015-06-28 | Disposition: A | Discharge status: Home / Self Care | Payer: Managed Care, Other (non HMO) | Source: Home / Self Care | Attending: Family Medicine | Admitting: Family Medicine

## 2015-06-28 ENCOUNTER — Encounter (HOSPITAL_COMMUNITY): Payer: Self-pay | Admitting: Emergency Medicine

## 2015-06-28 DIAGNOSIS — R69 Illness, unspecified: Principal | ICD-10-CM

## 2015-06-28 DIAGNOSIS — J111 Influenza due to unidentified influenza virus with other respiratory manifestations: Secondary | ICD-10-CM | POA: Diagnosis not present

## 2015-06-28 MED ORDER — BENZONATATE 100 MG PO CAPS: 200.0000 mg | ORAL_CAPSULE | Freq: Two times a day (BID) | ORAL | Status: DC | PRN

## 2015-06-28 MED ORDER — GUAIFENESIN ER 600 MG PO TB12: 1200.0000 mg | ORAL_TABLET | Freq: Two times a day (BID) | ORAL | Status: DC

## 2015-06-28 MED ORDER — FLUTICASONE PROPIONATE 50 MCG/ACT NA SUSP: 2.0000 | Freq: Every day | NASAL | Status: DC

## 2015-06-28 NOTE — ED Notes (Signed)
The patient presented to the [facility] with a complaint of fever and cough x 4 days.

## 2015-06-28 NOTE — ED Provider Notes (Signed)
CSN: [id number]     Arrival date & time 06/28/15  1424 History   First MD Initiated Contact with Patient 06/28/15 1713     Chief Complaint  Patient presents with  . Fever  . Cough   (Consider location/radiation/quality/duration/timing/severity/associated sxs/prior Treatment) Patient is a 45 y.o. male presenting with fever and cough. The history is provided by the patient. No language interpreter was used.  Fever Associated symptoms: chills, congestion, cough and rhinorrhea   Associated symptoms: no chest pain, no diarrhea, no ear pain, no nausea and no vomiting   Cough Associated symptoms: chills, fever and rhinorrhea   Associated symptoms: no chest pain, no diaphoresis, no ear pain, no shortness of breath and no wheezing   Patient presents with four day history of fevers, malaise, cough with sputum production and nasal congestion and rhinorrhea. Initially with headaches as well, which have improved. He feels that his fevers have subsided today as well.  Continues to cough, have nasal congestion and rhinorrhea.  Concerned about contagion to his mother, who is dying of pancreatic cancer and lives with the patient.   Of note, he did receive flu vaccine this season.  He is a nonsmoker. Married.  No known sick contacts.   Past Medical History  Diagnosis Date  . Seasonal allergies   . Barrett's esophagus    Past Surgical History  Procedure Laterality Date  . Knee arthroscopy w/ acl reconstruction    . Knee cartilage surgery    . Knee arthroscopy w/ meniscal repair    . Anterior cruciate ligament repair     History reviewed. No pertinent family history. Social History  Substance Use Topics  . Smoking status: Never Smoker   . Smokeless tobacco: None  . Alcohol Use: Yes     Comment: occasional    Review of Systems  Constitutional: Positive for fever, chills and fatigue. Negative for diaphoresis and appetite change.  HENT: Positive for congestion, ear discharge, postnasal drip,  rhinorrhea, sinus pressure and sneezing. Negative for ear pain.   Respiratory: Positive for cough. Negative for chest tightness, shortness of breath, wheezing and stridor.   Cardiovascular: Negative for chest pain.  Gastrointestinal: Negative for nausea, vomiting, abdominal pain and diarrhea.  All other systems reviewed and are negative.   Allergies  Erythromycin  Home Medications   Prior to Admission medications   Medication Sig Start Date End Date Taking? Authorizing Provider  omeprazole (PRILOSEC) 20 MG capsule Take 20 mg by mouth daily.   Yes Historical Provider, MD  Acetaminophen (TYLENOL PO) Take 400 mg by mouth 2 (two) times daily.    Historical Provider, MD  albuterol (PROVENTIL HFA;VENTOLIN HFA) 108 (90 BASE) MCG/ACT inhaler Inhale 1-2 puffs into the lungs every 6 (six) hours as needed for wheezing or shortness of breath. Patient not taking: Reported on 02/23/2015 02/12/14   Servando Salina, NP  benzonatate (TESSALON) 100 MG capsule Take 2 capsules (200 mg total) by mouth 2 (two) times daily as needed for cough. 06/28/15   Barbaraann Barthel, MD  chlorhexidine (PERIDEX) 0.12 % solution  08/16/14   Historical Provider, MD  cyclobenzaprine (FLEXERIL) 5 MG tablet Take 1 or 2 pills at bedtime as needed for muscle relaxant. 02/23/15   Peyton Najjar, MD  diclofenac (VOLTAREN) 75 MG EC tablet Take 1 tablet (75 mg total) by mouth 2 (two) times daily. 02/23/15   Peyton Najjar, MD  fluticasone (FLONASE) 50 MCG/ACT nasal spray Place 2 sprays into both nostrils daily. 06/28/15  Barbaraann Barthel, MD  guaiFENesin (MUCINEX) 600 MG 12 hr tablet Take 2 tablets (1,200 mg total) by mouth 2 (two) times daily. 06/28/15   Barbaraann Barthel, MD  ibuprofen (ADVIL,MOTRIN) 200 MG tablet Take 400 mg by mouth every 6 (six) hours as needed (pain).    Historical Provider, MD  Multiple Vitamins-Minerals (CENTRUM ADULTS PO) Take 1 tablet by mouth daily.    Historical Provider, MD  omeprazole (PRILOSEC) 20 MG capsule Take 20  mg by mouth daily.    Historical Provider, MD  predniSONE (DELTASONE) 20 MG tablet Take 3 tablets (60 mg total) by mouth daily. Patient not taking: Reported on 02/23/2015 01/28/15   Charm Rings, MD   Meds Ordered and Administered this Visit  Medications - No data to display  BP 119/86 mmHg  Pulse 69  Temp(Src) 98.1 F (36.7 C) (Oral)  Resp 16  SpO2 98% No data found.   Physical Exam  Constitutional: He appears well-developed and well-nourished. No distress.  HENT:  Head: Normocephalic and atraumatic.  Right Ear: External ear normal.  Left Ear: External ear normal.  Nose: Nose normal.  Mouth/Throat: Oropharynx is clear and moist. No oropharyngeal exudate.  Watery rhinorrhea. Injected nasal mucosa. No frontal or maxillary sinus tenderness.   Eyes: Conjunctivae and EOM are normal. Pupils are equal, round, and reactive to light. Right eye exhibits no discharge. Left eye exhibits no discharge. No scleral icterus.  Neck: Normal range of motion. Neck supple. No JVD present. No tracheal deviation present. No thyromegaly present.  Cardiovascular: Normal rate, regular rhythm and normal heart sounds.   Pulmonary/Chest: Effort normal and breath sounds normal. No stridor. No respiratory distress. He has no wheezes. He has no rales. He exhibits no tenderness.  Abdominal: Soft.  Lymphadenopathy:    He has no cervical adenopathy.  Skin: He is not diaphoretic.    ED Course  Procedures (including critical care time)  Labs Review Labs Reviewed - No data to display  Imaging Review No results found.   Visual Acuity Review  Right Eye Distance:   Left Eye Distance:   Bilateral Distance:    Right Eye Near:   Left Eye Near:    Bilateral Near:         MDM   1. Influenza-like illness    Likely ILI, now over 3 days since onset (described as 4 days ago onset).  Discussed hygiene measures to prevent spread; no role for antiviral at this stage of illness. Supportive measures  recommended/prescribed. Follow up as needed. Stressed that ILI not caused by the flu vaccine.   Paula Compton, MD    Barbaraann Barthel, MD 06/28/15 [phone]

## 2015-06-28 NOTE — Discharge Instructions (Signed)
It is a pleasure to see you today.  I believe your symptoms are related to a likely case of the flu.   For the cough, MUCINEX  tablets, take 2 tablets by mouth every 12 hours to thin mucus.   Tessalon  perles, take 1-2 perles by mouth every 12 hours as needed for cough.   Flonase nasal spray, 2 sprays per nostril every morning.  I advise staying away from immunocompromised persons for the next 2-3 days until you are feeling better.

## 2015-12-21 ENCOUNTER — Ambulatory Visit (HOSPITAL_COMMUNITY)
Admission: EM | Admit: 2015-12-21 | Discharge: 2015-12-21 | Disposition: A | Discharge status: Home / Self Care | Payer: Managed Care, Other (non HMO) | Attending: Physician Assistant | Admitting: Physician Assistant

## 2015-12-21 ENCOUNTER — Ambulatory Visit (INDEPENDENT_AMBULATORY_CARE_PROVIDER_SITE_OTHER): Payer: Managed Care, Other (non HMO)

## 2015-12-21 ENCOUNTER — Encounter (HOSPITAL_COMMUNITY): Payer: Self-pay | Admitting: Emergency Medicine

## 2015-12-21 DIAGNOSIS — M2570 Osteophyte, unspecified joint: Secondary | ICD-10-CM

## 2015-12-21 DIAGNOSIS — M898X7 Other specified disorders of bone, ankle and foot: Secondary | ICD-10-CM

## 2015-12-21 NOTE — ED Triage Notes (Signed)
The [facility] with a complaint of left great toe pain that has been ongoing for 1 year. The patient stated that he has seen a rheumatologist as well as had gout ruled out. The patient stated that he started having increased pain after playing badminton this weekend.

## 2015-12-21 NOTE — Discharge Instructions (Signed)
THERE IS NO SIGNS OF FRACTURE OR BAD ARTHRITIS DISEASE IN YOUR FOOT. THERE IS A BIT OF OVERGROWTH OF BONE, WHICH MAY BE CAUSING YOU PAIN.  I SUGGEST THAT YOU FOLLOW UP WITH PODIATRY AND TAKE THEIR ADVICE ON A TREATMENT PLAN.

## 2015-12-21 NOTE — ED Provider Notes (Signed)
CSN: [id number]     Arrival date & time 12/21/15  1135 History   First MD Initiated Contact with Patient 12/21/15 1224     Chief Complaint  Patient presents with  . Toe Pain   (Consider location/radiation/quality/duration/timing/severity/associated sxs/prior Treatment) HPI 44 Y/O MALE WITH LONG TERM PAIN IN THE HIS JOINTS. HAS BEEN SEEN BY ORTHO, RHEUMATOLOGY, [facility] FOR JOINT PAIN, NO GOUT, OR RA.  WAS PLAYING [place] AND TENNIS, NOW HAS WORSE PAIN IN THE GREAT TOE. NO KNOWN INJURY. PREVIOUS SURG ON RIGHT KNEE. DJD  PT WOULD LIKE XRAY OF TOE. Past Medical History:  Diagnosis Date  . Barrett's esophagus   . Seasonal allergies    Past Surgical History:  Procedure Laterality Date  . ANTERIOR CRUCIATE LIGAMENT REPAIR    . KNEE ARTHROSCOPY W/ ACL RECONSTRUCTION    . KNEE ARTHROSCOPY W/ MENISCAL REPAIR    . KNEE CARTILAGE SURGERY     History reviewed. No pertinent family history. Social History  Substance Use Topics  . Smoking status: Never Smoker  . Smokeless tobacco: Not on file  . Alcohol use Yes     Comment: occasional    Review of Systems  Denies: HEADACHE, NAUSEA, ABDOMINAL PAIN, CHEST PAIN, CONGESTION, DYSURIA, SHORTNESS OF BREATH  Allergies  Erythromycin  Home Medications   Prior to Admission medications   Medication Sig Start Date End Date Taking? Authorizing Provider  ibuprofen (ADVIL,MOTRIN) 200 MG tablet Take 400 mg by mouth every 6 (six) hours as needed (pain).   Yes Historical Provider, MD  omeprazole (PRILOSEC) 20 MG capsule Take 20 mg by mouth daily.   Yes Historical Provider, MD  Acetaminophen (TYLENOL PO) Take 400 mg by mouth 2 (two) times daily.    Historical Provider, MD  albuterol (PROVENTIL HFA;VENTOLIN HFA) 108 (90 BASE) MCG/ACT inhaler Inhale 1-2 puffs into the lungs every 6 (six) hours as needed for wheezing or shortness of breath. Patient not taking: Reported on 02/23/2015 02/12/14   Servando Salinaatherine H Rossi, NP  benzonatate (TESSALON) 100 MG capsule  Take 2 capsules (200 mg total) by mouth 2 (two) times daily as needed for cough. 06/28/15   Barbaraann BarthelJames O Breen, MD  chlorhexidine (PERIDEX) 0.12 % solution  08/16/14   Historical Provider, MD  cyclobenzaprine (FLEXERIL) 5 MG tablet Take 1 or 2 pills at bedtime as needed for muscle relaxant. 02/23/15   Peyton Najjaravid H Hopper, MD  diclofenac (VOLTAREN) 75 MG EC tablet Take 1 tablet (75 mg total) by mouth 2 (two) times daily. 02/23/15   Peyton Najjaravid H Hopper, MD  fluticasone (FLONASE) 50 MCG/ACT nasal spray Place 2 sprays into both nostrils daily. 06/28/15   Barbaraann BarthelJames O Breen, MD  guaiFENesin (MUCINEX) 600 MG 12 hr tablet Take 2 tablets (1,200 mg total) by mouth 2 (two) times daily. 06/28/15   Barbaraann BarthelJames O Breen, MD  Multiple Vitamins-Minerals (CENTRUM ADULTS PO) Take 1 tablet by mouth daily.    Historical Provider, MD  omeprazole (PRILOSEC) 20 MG capsule Take 20 mg by mouth daily.    Historical Provider, MD  predniSONE (DELTASONE) 20 MG tablet Take 3 tablets (60 mg total) by mouth daily. Patient not taking: Reported on 02/23/2015 01/28/15   Charm RingsErin J Honig, MD   Meds Ordered and Administered this Visit  Medications - No data to display  BP 136/87 (BP Location: Left Arm)   Pulse 60   Temp 98.6 F (37 C) (Oral)   Resp 14   SpO2 100%  No data found.   Physical Exam NURSES NOTES AND  VITAL SIGNS REVIEWED. CONSTITUTIONAL: Well developed, well nourished, no acute distress HEENT: normocephalic, atraumatic EYES: Conjunctiva normal NECK:normal ROM, supple, no adenopathy PULMONARY:No respiratory distress, normal effort ABDOMINAL: Soft, ND, NT BS+, No CVAT MUSCULOSKELETAL: Normal ROM of all extremities,  LEFT GREAT TOE WITHOUT TENDERNESS. NO VISIBLE OF PALPABLE DEFORMITY.  SKIN: warm and dry without rash PSYCHIATRIC: Mood and affect, behavior are normal  Urgent Care Course   Clinical Course    Procedures (including critical care time)  Labs Review Labs Reviewed - No data to display  Imaging Review Dg Toe Great  Left  Result Date: 12/21/2015 CLINICAL DATA:  Left great toe pain when playing tennis. EXAM: LEFT GREAT TOE COMPARISON:  11/14/2014 FINDINGS: There is an exostosis from the medial base of the great toe distal phalanx. No degenerative joint narrowing or spurring. No erosions or fracture. Normal alignment. IMPRESSION: 1. No acute or arthritic finding. 2. Distal phalanx exostosis. Electronically Signed   By: Marnee SpringJonathon  Watts M.D.   On: 12/21/2015 12:53   DISCUSSED WITH PATIENT  Visual Acuity Review  Right Eye Distance:   Left Eye Distance:   Bilateral Distance:    Right Eye Near:   Left Eye Near:    Bilateral Near:         MDM   1. Exostosis of toe     Patient is reassured that there are no issues that require transfer to higher level of care at this time or additional tests. Patient is advised to continue home symptomatic treatment. Patient is advised that if there are new or worsening symptoms to attend the emergency department, contact primary care provider, or return to UC. Instructions of care provided discharged home in stable condition.    THIS NOTE WAS GENERATED USING A VOICE RECOGNITION SOFTWARE PROGRAM. ALL REASONABLE EFFORTS  WERE MADE TO PROOFREAD THIS DOCUMENT FOR ACCURACY.  I have verbally reviewed the discharge instructions with the patient. A printed AVS was given to the patient.  All questions were answered prior to discharge.      Tharon AquasFrank C Lateshia Schmoker, PA 12/21/15 1321

## 2016-01-02 ENCOUNTER — Encounter (HOSPITAL_COMMUNITY): Payer: Self-pay | Admitting: Emergency Medicine

## 2016-01-02 ENCOUNTER — Ambulatory Visit (INDEPENDENT_AMBULATORY_CARE_PROVIDER_SITE_OTHER): Payer: Managed Care, Other (non HMO)

## 2016-01-02 ENCOUNTER — Ambulatory Visit (HOSPITAL_COMMUNITY)
Admission: EM | Admit: 2016-01-02 | Discharge: 2016-01-02 | Disposition: A | Discharge status: Home / Self Care | Payer: Managed Care, Other (non HMO) | Attending: Radiology | Admitting: Radiology

## 2016-01-02 DIAGNOSIS — S4992XA Unspecified injury of left shoulder and upper arm, initial encounter: Secondary | ICD-10-CM

## 2016-01-02 DIAGNOSIS — Z23 Encounter for immunization: Secondary | ICD-10-CM | POA: Diagnosis not present

## 2016-01-02 DIAGNOSIS — M79641 Pain in right hand: Secondary | ICD-10-CM

## 2016-01-02 DIAGNOSIS — M25561 Pain in right knee: Secondary | ICD-10-CM

## 2016-01-02 DIAGNOSIS — M79672 Pain in left foot: Secondary | ICD-10-CM

## 2016-01-02 DIAGNOSIS — M2550 Pain in unspecified joint: Secondary | ICD-10-CM

## 2016-01-02 DIAGNOSIS — M25562 Pain in left knee: Secondary | ICD-10-CM

## 2016-01-02 MED ORDER — DICLOFENAC SODIUM 75 MG PO TBEC: 75.0000 mg | DELAYED_RELEASE_TABLET | Freq: Two times a day (BID) | ORAL | 0 refills | Status: DC

## 2016-01-02 MED ORDER — TETANUS-DIPHTHERIA TOXOIDS TD 5-2 LFU IM INJ: 0.5000 mL | INJECTION | Freq: Once | INTRAMUSCULAR | Status: DC

## 2016-01-02 MED ORDER — TETANUS-DIPHTH-ACELL PERTUSSIS 5-2.5-18.5 LF-MCG/0.5 IM SUSP
INTRAMUSCULAR | Status: AC
  Filled 2016-01-02: qty 0.5

## 2016-01-02 MED ORDER — TETANUS-DIPHTH-ACELL PERTUSSIS 5-2.5-18.5 LF-MCG/0.5 IM SUSP
0.5000 mL | Freq: Once | INTRAMUSCULAR | Status: AC
  Administered 2016-01-02: 0.5 mL via INTRAMUSCULAR
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
## 2016-01-05 ENCOUNTER — Ambulatory Visit (INDEPENDENT_AMBULATORY_CARE_PROVIDER_SITE_OTHER): Payer: Managed Care, Other (non HMO) | Admitting: Podiatry

## 2016-01-05 ENCOUNTER — Ambulatory Visit (INDEPENDENT_AMBULATORY_CARE_PROVIDER_SITE_OTHER): Payer: Managed Care, Other (non HMO)

## 2016-01-05 ENCOUNTER — Encounter: Payer: Self-pay | Admitting: Podiatry

## 2016-01-05 VITALS — BP 150/76 | HR 67 | Resp 16 | Ht 67.0 in | Wt 155.0 lb

## 2016-01-05 DIAGNOSIS — M779 Enthesopathy, unspecified: Secondary | ICD-10-CM

## 2016-01-05 DIAGNOSIS — M79672 Pain in left foot: Secondary | ICD-10-CM

## 2016-01-05 DIAGNOSIS — M7752 Other enthesopathy of left foot: Secondary | ICD-10-CM

## 2016-01-05 DIAGNOSIS — M778 Other enthesopathies, not elsewhere classified: Secondary | ICD-10-CM

## 2016-01-05 MED ORDER — MELOXICAM 15 MG PO TABS: 15.0000 mg | ORAL_TABLET | Freq: Every day | ORAL | 3 refills | Status: DC

## 2016-01-05 NOTE — Progress Notes (Signed)
   Subjective:    Patient ID: Andrew FootmanGURPREET Dunlap, male    DOB: 05/02/1971, 45 y.o.   MRN: [id number]  HPI: He states he has had eft great toe pain for several months. States it is very painful after playing sports. He has seen multiple doctors and has had multiple arthritic profiles performed illustrating that there is no arthritis is seropositive. He denies trauma.  Review of Systems  All other systems reviewed and are negative.      Objective:   Physical Exam: Vital signs are stable he is alert and oriented 3 pulses are palpable. Neurologic sensorium is intact. Attention reflexes are intact. Muscle strength is normal bilateral. He has no pain on range of motion of the first metatarsophalangeal joint of the left foot there is a palpable dorsal ridge noted. Radiographs taken today do demonstrate what appears to be a early sign of osteoarthritis with joint space narrowing some subchondral sclerosis and a dorsal spur. CONSISTENT with early osteoarthritic changes.        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  Plan: We discussed etiology pathology conservative or surgical therapies at this point he understands that taking an anti-inflammatory for a protracted period of time with the pain will help reduce the symptoms. We did discuss the possible need for surgical intervention in the far future I don't think this will progress very rapidly based on the findings. I did prescribe meloxicam 15 mg 1 by mouth daily this was called into his pharmacy.

## 2016-01-16 ENCOUNTER — Other Ambulatory Visit: Payer: Self-pay | Admitting: Emergency Medicine

## 2016-01-16 DIAGNOSIS — T1490XA Injury, unspecified, initial encounter: Secondary | ICD-10-CM

## 2016-01-31 ENCOUNTER — Inpatient Hospital Stay: Admission: RE | Admit: 2016-01-31 | Payer: Self-pay | Source: Ambulatory Visit

## 2016-01-31 ENCOUNTER — Other Ambulatory Visit: Payer: Self-pay

## 2016-08-30 ENCOUNTER — Ambulatory Visit (INDEPENDENT_AMBULATORY_CARE_PROVIDER_SITE_OTHER): Payer: Commercial Managed Care - PPO | Admitting: Psychiatry

## 2016-08-30 ENCOUNTER — Encounter (HOSPITAL_COMMUNITY): Payer: Self-pay | Admitting: Psychiatry

## 2016-08-30 ENCOUNTER — Ambulatory Visit (HOSPITAL_COMMUNITY): Payer: Self-pay | Admitting: Psychiatry

## 2016-08-30 ENCOUNTER — Encounter (INDEPENDENT_AMBULATORY_CARE_PROVIDER_SITE_OTHER): Payer: Self-pay

## 2016-08-30 VITALS — BP 124/70 | HR 59 | Ht 67.0 in | Wt 157.2 lb

## 2016-08-30 DIAGNOSIS — F4322 Adjustment disorder with anxiety: Secondary | ICD-10-CM | POA: Diagnosis not present

## 2016-08-30 DIAGNOSIS — F332 Major depressive disorder, recurrent severe without psychotic features: Secondary | ICD-10-CM | POA: Diagnosis not present

## 2016-08-30 NOTE — Progress Notes (Signed)
Psychiatric Initial Adult Assessment   Patient Identification: Andrew Dawson MRN:  [id number] Date of Evaluation:  08/30/2016 Referral Source: Self referred Chief Complaint:   Visit Diagnosis:    ICD-9-CM ICD-10-CM   1. Adjustment disorder with anxious mood 309.24 F43.22     History of Present Illness:  Andrew Dawson is 46 year old [place] American, employed, married man who is self-referred due to stress related to his marriage.  Patient told he's been married since Sep 19, 2010 and his wife is from [place].  She has to wait year and a half to get a visa to [place].  She moved to the states in December 2014.  Patient told it was arranged marriage and from the beginning he started to have issues.  Patient mentioned there are multiple stressors in their marriage.  She did not like non-vegetable food and gets very agitated and angry when he eat any meat.  There are also other issues in their marriage.  In the beginning patient's parents were staying with them but as per patient she started to have a lot of issues with them and there were times that she was cursing, yelling, loud 2 words patient's mother and they decided to move back to live with their daughter in [place].  Later in the manage they are having issues with every small things causing too much stress in their marriage.  Patient mentioned that his wife has mental disorder and anxiety and there are times when she has been so agitated and aggressive and tried to hit herself that she had to console her and calm her down.  Last year his wife moved to [place] and patient decided that he will end the relationship but after 6 months staying in [place] his wife decided to come back after she got a job in [place].  In the beginning patient does not want to associate are getting touch with her but then his wife started calling him and with guilt he started to go on the weekends to help her out.  While his wife staying in [place] last year her parents forced her to  see psychiatrist and she was prescribed medication and as per patient she did very well on the medication but once she moved to [place] she decided to stop the medication.  Patient belongs to Asian culture and he feels very guilty and regret about his marriage.  He consider himself as a failure and he endorse some time filling low self-esteem, sensitive, rejected.  His other stress is coming from his work.  He is working as a Sport and exercise psychologist at [employer] and lately the job is very stressful.  Though he denies any suicidal thoughts or homicidal thought but endorsed sometimes frustrated, irritable, anxious and indecisive.  Sometime he feel hopeless but denies any hallucination, paranoia, mania, psychosis or any hallucination.  His appetite is okay.  He admitted lately being isolated, withdrawn and does not have motivation to do things.  Patient has a limited social network.  He lives by himself.  His sister lives in [place].  His father died in 2014-09-19 and mother last year.  He feels guilty that he did not spend more time with his mother in her last year.  Patient denies any crying spells sometime he feels like a motivation to do things.  Patient denies any illegal substance use.  He claims social drinking but denies any binge or any intoxication.  He lives by himself.  Currently he is not taking any medication and he is not interested  to take one.  He is open to see a therapist.  Associated Signs/Symptoms: Depression Symptoms:  depressed mood, fatigue, difficulty concentrating, anxiety, (Hypo) Manic Symptoms:  Distractibility, Anxiety Symptoms:  Social Anxiety, Psychotic Symptoms:  Patient denies any psychotic symptoms PTSD Symptoms: NA  Past Psychiatric History: Patient seen briefly by psychiatrist 3 years ago when he got divorced from his first marriage.  He was never prescribed any medication.  Patient denied any history of psychosis, mania, OCD, PTSD or any aggressive behavior.  Previous Psychotropic  Medications: No   Substance Abuse History in the last 12 months:  No.  Consequences of Substance Abuse: Negative  Past Medical History:  Past Medical History:  Diagnosis Date  . Barrett's esophagus   . Seasonal allergies     Past Surgical History:  Procedure Laterality Date  . ANTERIOR CRUCIATE LIGAMENT REPAIR    . KNEE ARTHROSCOPY W/ ACL RECONSTRUCTION    . KNEE ARTHROSCOPY W/ MENISCAL REPAIR    . KNEE CARTILAGE SURGERY      Family Psychiatric History: Patient endorse father has mental disorder.  Family History: History reviewed. No pertinent family history.  Social History:   Social History   Social History  . Marital status: Married    Spouse name: N/A  . Number of children: N/A  . Years of education: N/A   Social History Main Topics  . Smoking status: Never Smoker  . Smokeless tobacco: Never Used  . Alcohol use Yes     Comment: occasional  . Drug use: No  . Sexual activity: Not Asked   Other Topics Concern  . None   Social History Narrative   ** Merged History Encounter **        Additional Social History: Patient born in [place].  He grew up there.  Both his parents deceased.  Patient first marriage last for 3 years.  Patient told his first wife was physician but very busy in his study and he believed she became depressed when she could not pass board exams I decided to go back to [place].  Patient remarried in 2012 and his second wife is also from [place].  He has no children.  Patient lives by himself.  He has one sister who lives in [place].     Allergies:   Allergies  Allergen Reactions  . Erythromycin Hives    Metabolic Disorder Labs: No results found for: HGBA1C, MPG No results found for: PROLACTIN No results found for: CHOL, TRIG, HDL, CHOLHDL, VLDL, LDLCALC   Current Medications: Current Outpatient Prescriptions  Medication Sig Dispense Refill  . omeprazole (PRILOSEC) 20 MG capsule Take 20 mg by mouth daily.     No current  facility-administered medications for this visit.     Neurologic: Headache: No Seizure: No Paresthesias:No  Musculoskeletal: Strength & Muscle Tone: within normal limits Gait & Station: normal Patient leans: N/A  Psychiatric Specialty Exam: ROS  Blood pressure 124/70, pulse (!) 59, height  (1.702 m), weight 157 lb 3.2 oz (71.3 kg).Body mass index is 24.62 kg/m.  General Appearance: Casual  Eye Contact:  Good  Speech:  Clear and Coherent  Volume:  Normal  Mood:  Angry and Dysphoric  Affect:  Appropriate  Thought Process:  Coherent  Orientation:  Full (Time, Place, and Person)  Thought Content:  Logical and Rumination  Suicidal Thoughts:  No  Homicidal Thoughts:  No  Memory:  Immediate;   Good Recent;   Good Remote;   Good  Judgement:  Good  Insight:  Good  Psychomotor Activity:  Normal  Concentration:  Concentration: Fair and Attention Span: Good  Recall:  Good  Fund of Knowledge:Good  Language: Good  Akathisia:  No  Handed:  Right  AIMS (if indicated):  0  Assets:  Communication Skills Desire for Improvement Physical Health Talents/Skills  ADL's:  Intact  Cognition: WNL  Sleep:  Fair    Assessment: Adjustment disorder with anxious mood.  Depressive disorder NOS.  Plan: Reassurance given.  Patient is not interested to take any psychiatric medication.  However he is open to see a marriage Veterinary surgeon.  Recommended Glendell Docker for counseling.  Discuss safety plan that anytime having active suicidal thoughts or homicidal thoughts and he need to call 911 or go to the local emergency room.  Recommended to call us back if he has any question or any concern.  We will not schedule any appointment at this time however once he receive counseling treatment and decided to come back for medication management he will call us for appointment.  Shardai Star T., MD 4/24/201812:14 PM

## 2016-09-08 ENCOUNTER — Encounter (HOSPITAL_COMMUNITY): Payer: Self-pay | Admitting: *Deleted

## 2016-09-08 ENCOUNTER — Ambulatory Visit (HOSPITAL_COMMUNITY)
Admission: EM | Admit: 2016-09-08 | Discharge: 2016-09-08 | Disposition: A | Discharge status: Home / Self Care | Payer: Commercial Managed Care - PPO | Attending: Family Medicine | Admitting: Family Medicine

## 2016-09-08 DIAGNOSIS — M7751 Other enthesopathy of right foot: Secondary | ICD-10-CM

## 2016-09-08 DIAGNOSIS — M65879 Other synovitis and tenosynovitis, unspecified ankle and foot: Secondary | ICD-10-CM

## 2016-09-08 DIAGNOSIS — K648 Other hemorrhoids: Secondary | ICD-10-CM

## 2016-09-08 DIAGNOSIS — S86899A Other injury of other muscle(s) and tendon(s) at lower leg level, unspecified leg, initial encounter: Secondary | ICD-10-CM | POA: Diagnosis not present

## 2016-09-08 MED ORDER — METHYLPREDNISOLONE 4 MG PO TBPK: ORAL_TABLET | ORAL | 0 refills | Status: AC

## 2016-09-08 MED ORDER — HYDROCORTISONE ACETATE 25 MG RE SUPP: 25.0000 mg | Freq: Two times a day (BID) | RECTAL | 1 refills | Status: AC

## 2016-09-08 NOTE — ED Provider Notes (Signed)
CSN: [id number]     Arrival date & time 09/08/16  1847 History   First MD Initiated Contact with Patient 09/08/16 1918     Chief Complaint  Patient presents with  . Leg Pain   (Consider location/radiation/quality/duration/timing/severity/associated sxs/prior Treatment) Patient c/o bilateral shin  Discomfort and right ankle discomfort.  He c/o anal pruritis and some blood in stool with BM and with wiping.   The history is provided by the patient.  Leg Pain  Location:  Ankle Time since incident:  2 days Injury: no   Ankle location:  R ankle and L ankle Pain details:    Quality:  Aching   Radiates to:  Does not radiate   Severity:  Moderate   Onset quality:  Sudden   Duration:  2 days   Timing:  Constant   Progression:  Worsening Chronicity:  New Dislocation: no   Foreign body present:  No foreign bodies Tetanus status:  Unknown Prior injury to area:  No Relieved by:  Nothing Worsened by:  Nothing Ineffective treatments:  None tried   Past Medical History:  Diagnosis Date  . Barrett's esophagus   . Seasonal allergies    Past Surgical History:  Procedure Laterality Date  . ANTERIOR CRUCIATE LIGAMENT REPAIR    . KNEE ARTHROSCOPY W/ ACL RECONSTRUCTION    . KNEE ARTHROSCOPY W/ MENISCAL REPAIR    . KNEE CARTILAGE SURGERY     History reviewed. No pertinent family history. Social History  Substance Use Topics  . Smoking status: Never Smoker  . Smokeless tobacco: Never Used  . Alcohol use Yes     Comment: occasional    Review of Systems  Constitutional: Negative.   HENT: Negative.   Eyes: Negative.   Respiratory: Negative.   Cardiovascular: Negative.   Gastrointestinal: Positive for blood in stool and constipation.  Endocrine: Negative.   Genitourinary: Negative.   Musculoskeletal: Positive for arthralgias.  Allergic/Immunologic: Negative.   Neurological: Negative.   Hematological: Negative.   Psychiatric/Behavioral: Negative.     Allergies   Erythromycin  Home Medications   Prior to Admission medications   Medication Sig Start Date End Date Taking? Authorizing Provider  hydrocortisone (ANUSOL-HC) 25 MG suppository Place 1 suppository (25 mg total) rectally 2 (two) times daily. 09/08/16   Deatra CanterWilliam J Oxford, FNP  methylPREDNISolone (MEDROL DOSEPAK) 4 MG TBPK tablet Take 6-5-4-3-2-1 po qd 09/08/16   Deatra CanterWilliam J Oxford, FNP  omeprazole (PRILOSEC) 20 MG capsule Take 20 mg by mouth daily.    Historical Provider, MD   Meds Ordered and Administered this Visit  Medications - No data to display  BP (!) 142/82 (BP Location: Right Arm)   Pulse 78   Temp 98.6 F (37 C)   Resp 18   SpO2 100%  No data found.   Physical Exam  Constitutional: He is oriented to person, place, and time. He appears well-developed and well-nourished.  HENT:  Head: Normocephalic and atraumatic.  Eyes: Conjunctivae and EOM are normal. Pupils are equal, round, and reactive to light.  Neck: Normal range of motion. Neck supple.  Cardiovascular: Normal rate, regular rhythm and normal heart sounds.   Pulmonary/Chest: Effort normal and breath sounds normal.  Abdominal: Soft. Bowel sounds are normal.  Musculoskeletal: He exhibits tenderness.  Crepitus over right ankle and tibia. TTP bilateral shins  Neurological: He is alert and oriented to person, place, and time.  Nursing note and vitals reviewed.   Urgent Care Course     Procedures (including critical care time)  Labs Review Labs Reviewed - No data to display  Imaging Review No results found.   Visual Acuity Review  Right Eye Distance:   Left Eye Distance:   Bilateral Distance:    Right Eye Near:   Left Eye Near:    Bilateral Near:         MDM   1. Shin splints, initial encounter   2. Right ankle tendonitis   3. Other hemorrhoids    Medrol dose pack Anusol HC one pr bid #14 w/1rf      Deatra Canter, FNP 09/08/16 2026

## 2016-09-08 NOTE — ED Triage Notes (Signed)
Pain     r     Leg      Shin  3  Days   Has  Been      Program   X   sev  Weeks   Denies       Any      specefic   Injury     denys  Any  Chest  Pain   Or   Shortness

## 2017-02-14 IMAGING — CR DG HAND COMPLETE 3+V*R*
3 series · 3 of 3 positions shown · non-contrast
Comparison: None.

CLINICAL DATA: Pain in the right index finger focused at the MCP
joint.

EXAM:
RIGHT HAND - COMPLETE 3+ VIEW

[x hand pa right]
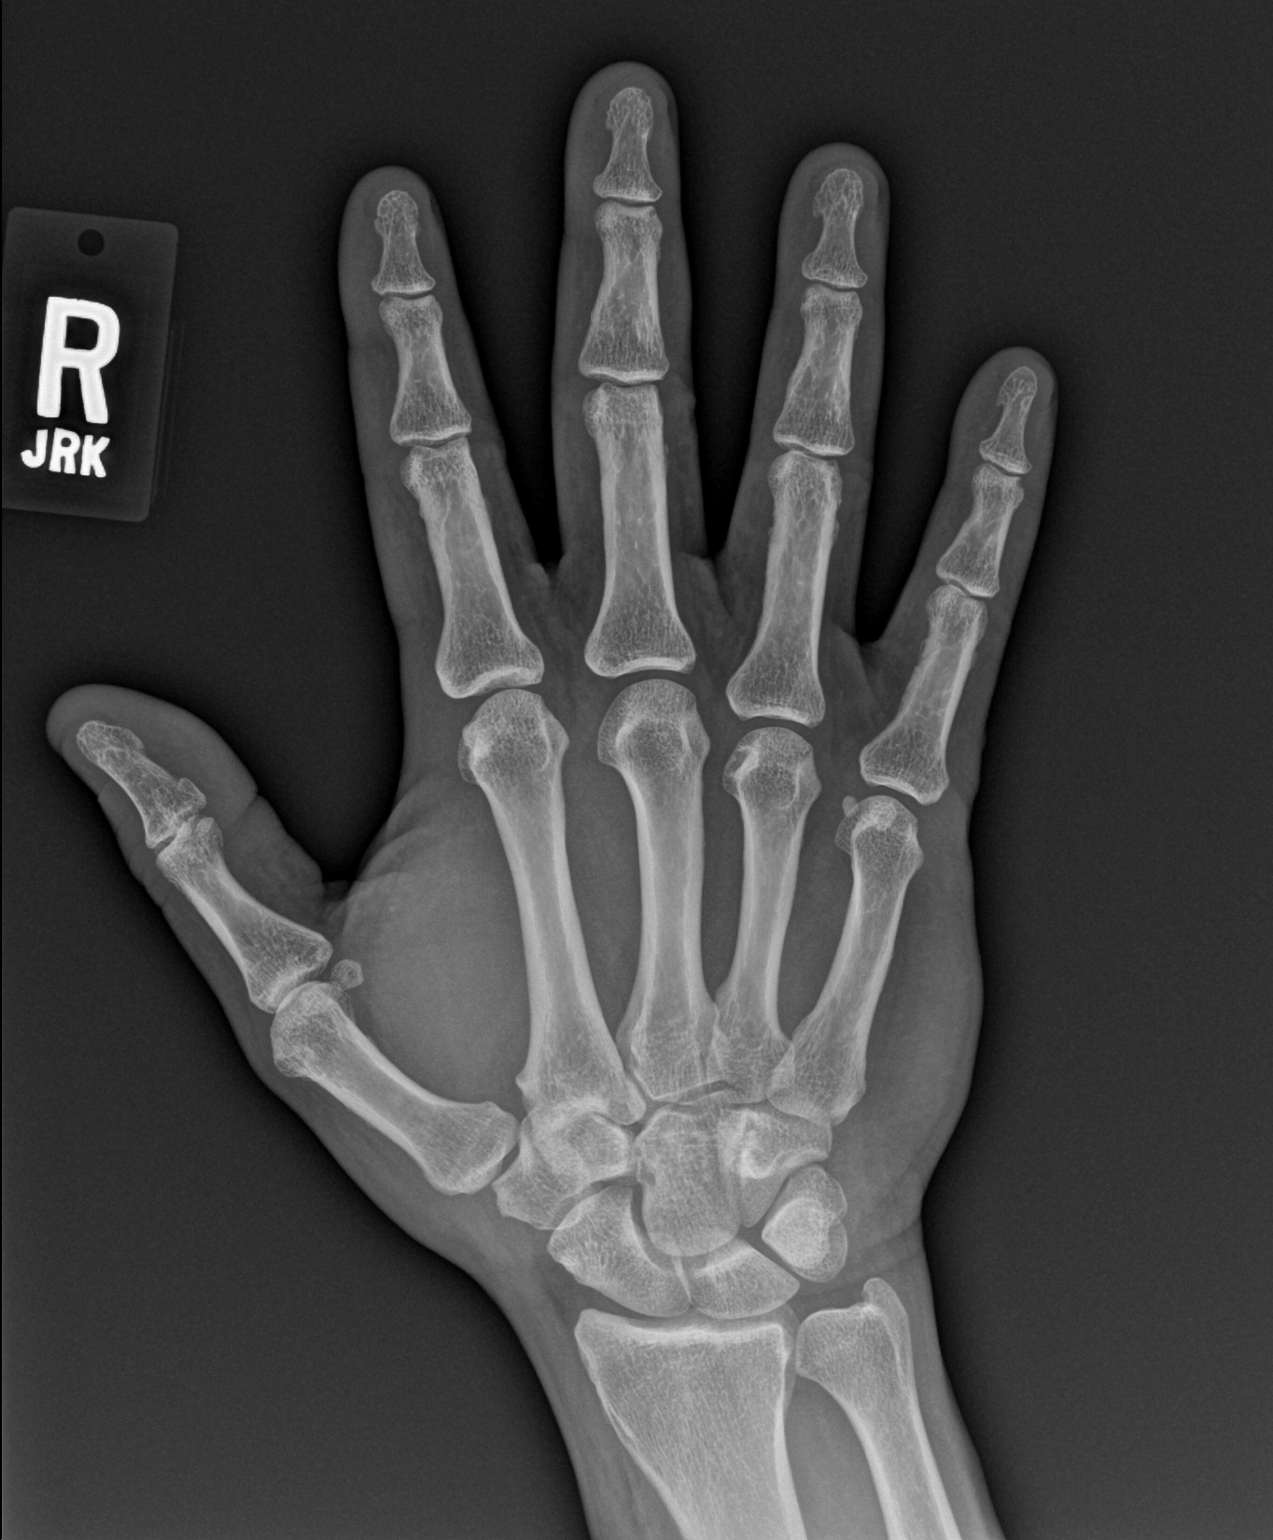

[x hand obl right]
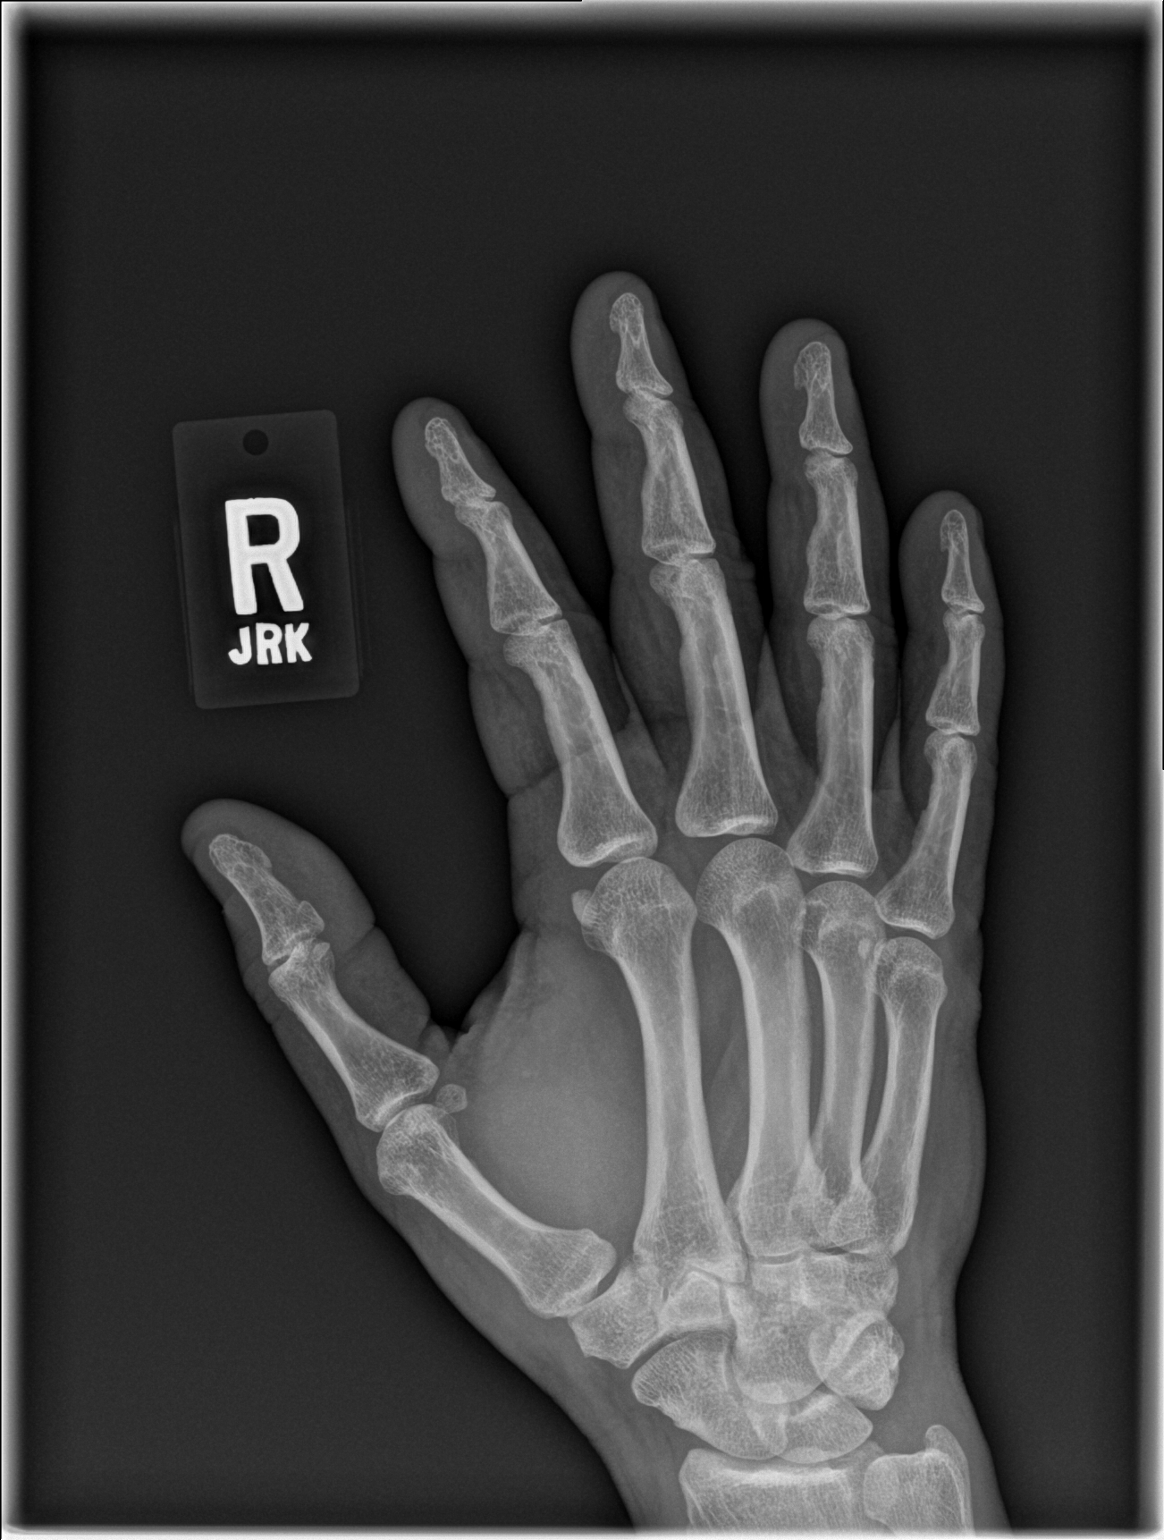

[x hand lat right]
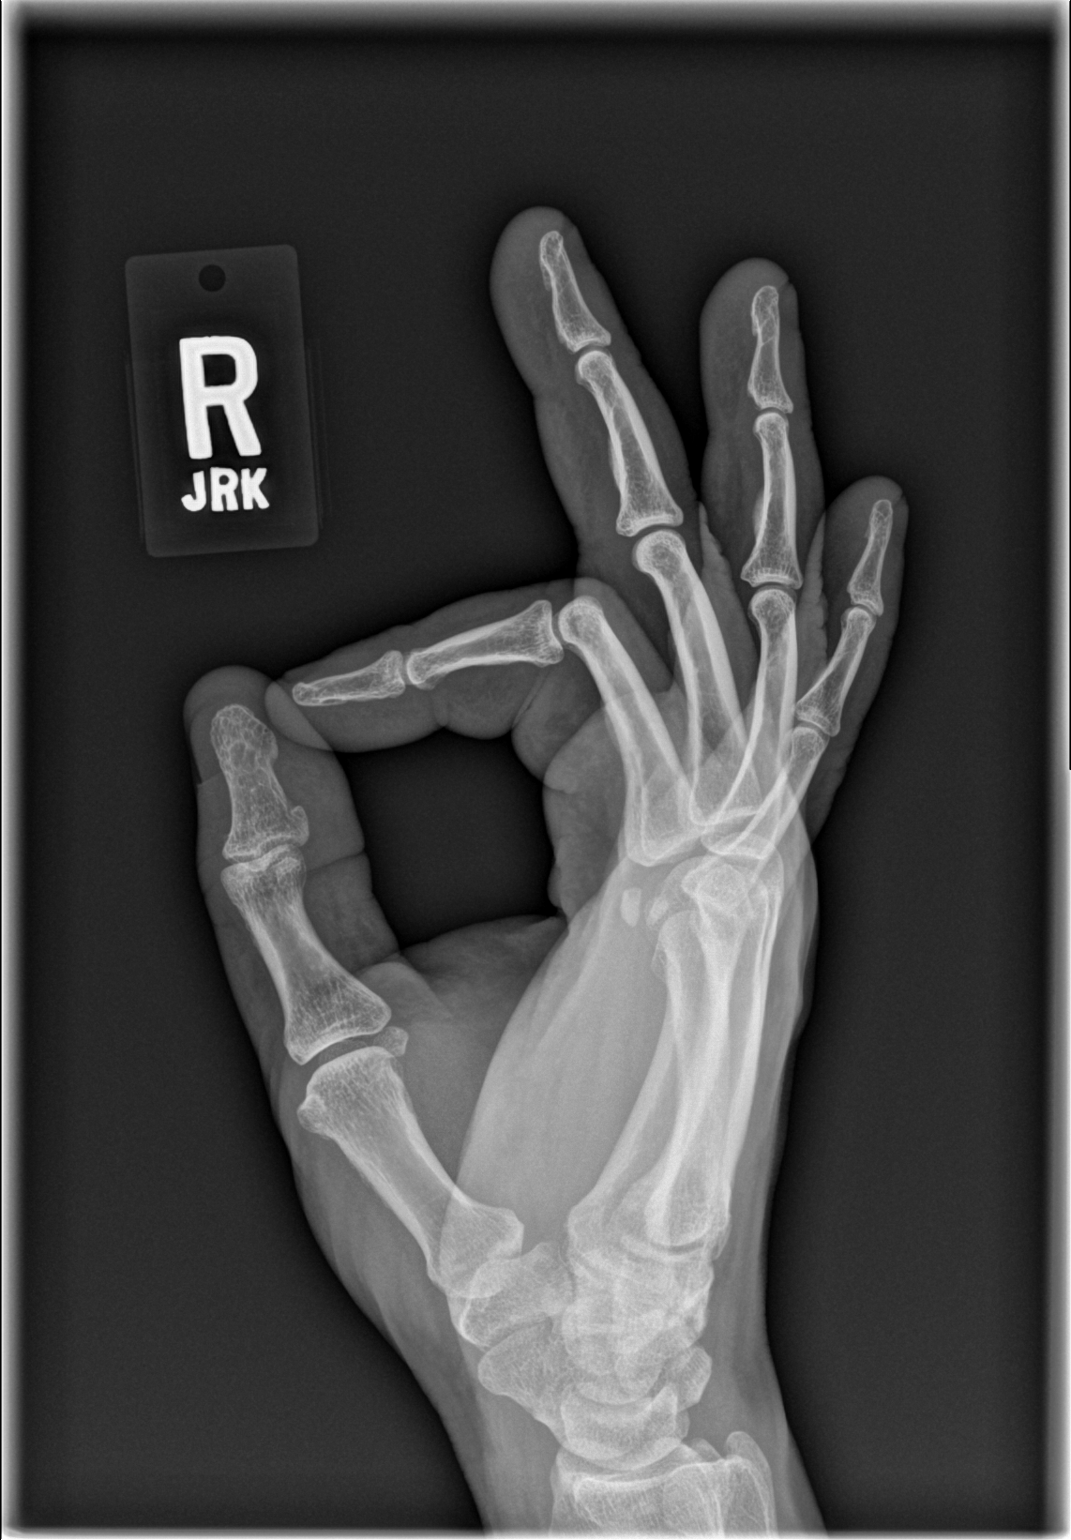

[3 of 3 positions shown; findings below may reference images not displayed]

FINDINGS: There is no evidence of fracture or dislocation. There is no
evidence of arthropathy or other focal bone abnormality. Soft
tissues are unremarkable.
IMPRESSION: Negative right hand radiographs.

## 2017-02-14 IMAGING — CR DG FOOT COMPLETE 3+V*L*
3 series · 3 of 3 positions shown · non-contrast
Comparison: None.

CLINICAL DATA: Left great toe pain. Initial evaluation. No known
injury.

EXAM:
LEFT FOOT - COMPLETE 3+ VIEW

[x foot ap left]
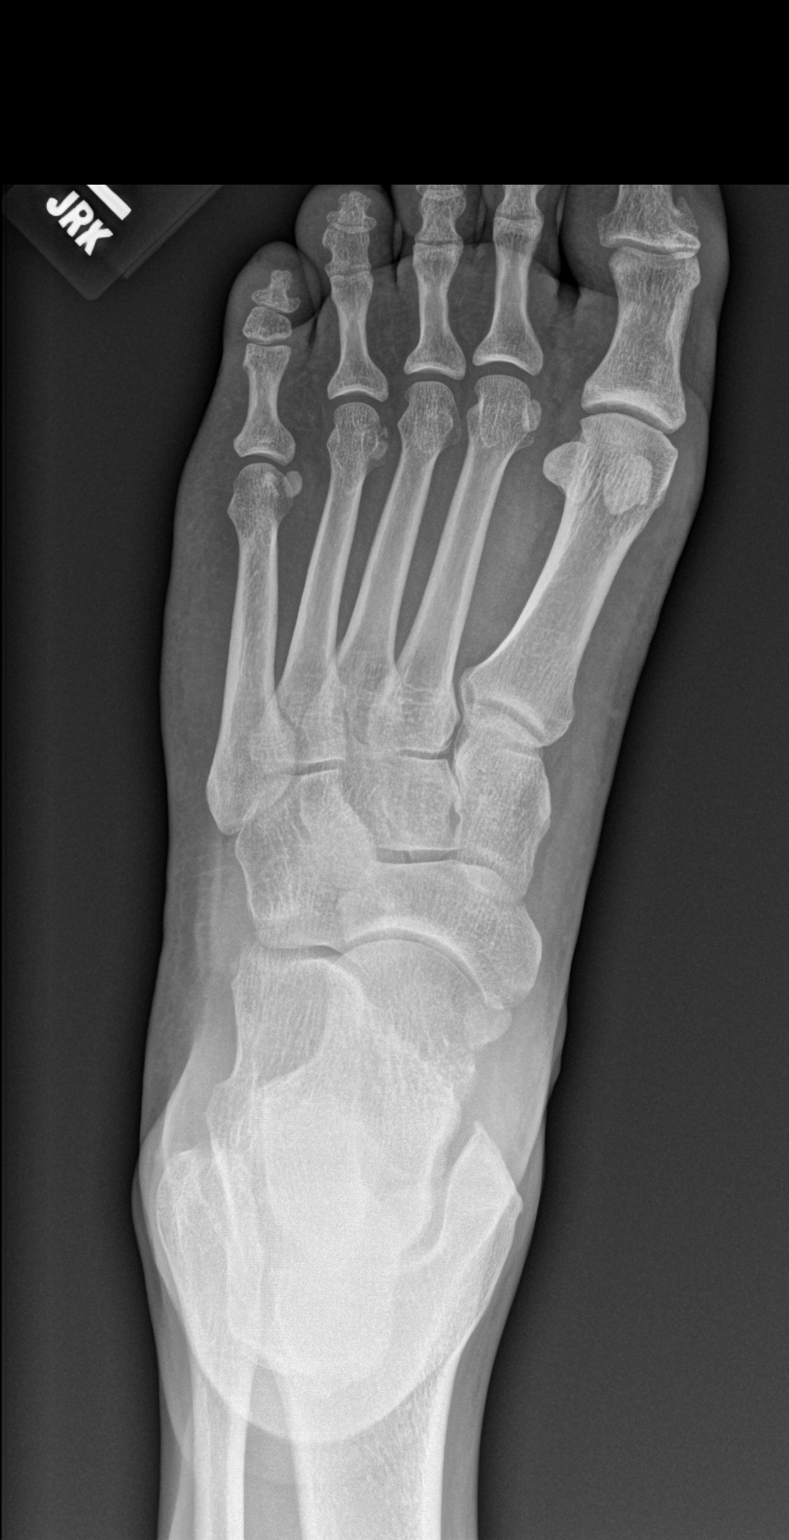

[x foot obl left]
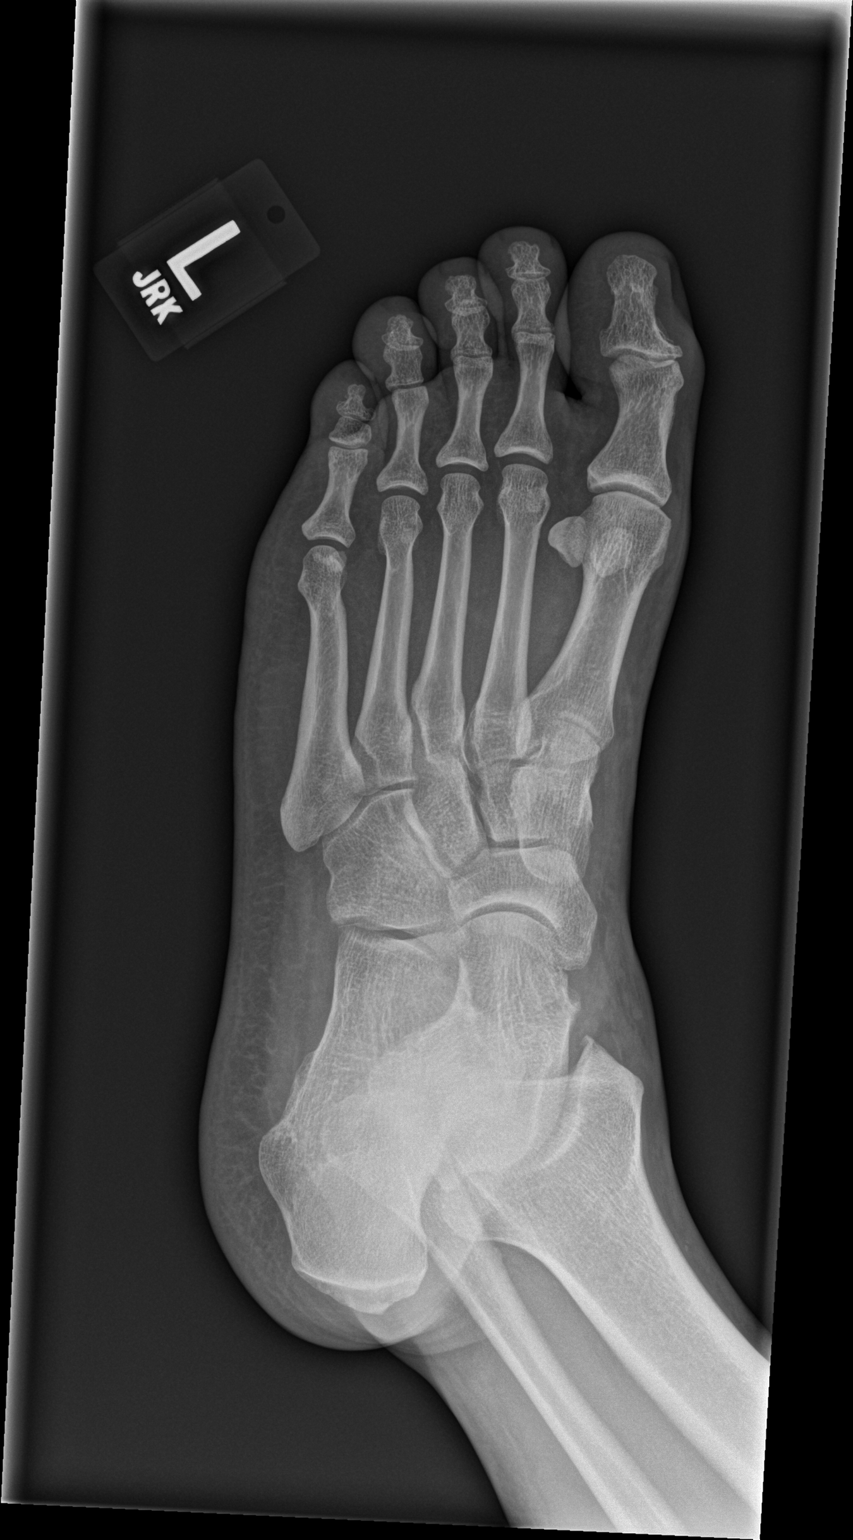

[x foot lat left]
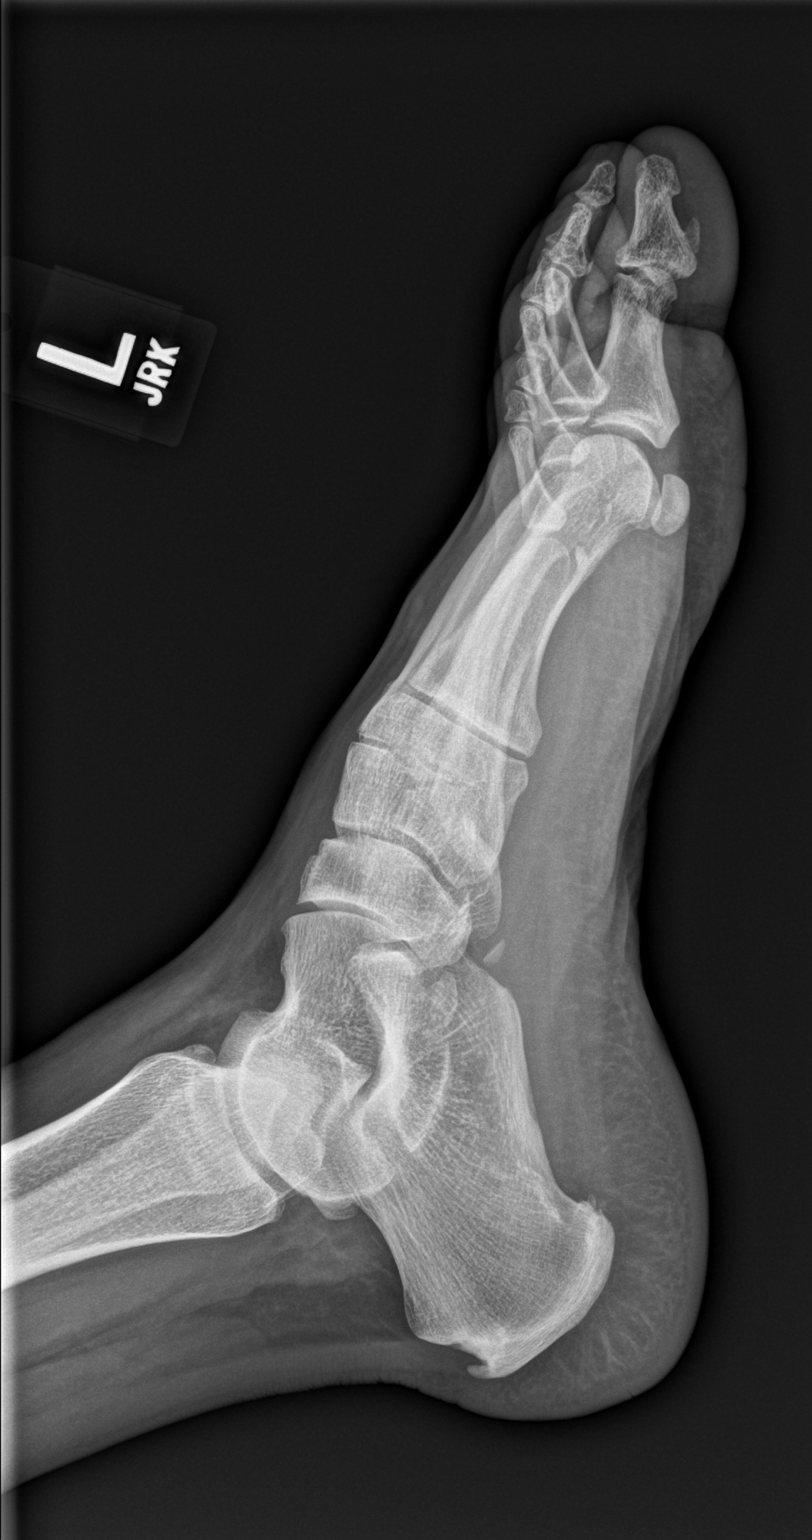

[3 of 3 positions shown; findings below may reference images not displayed]

FINDINGS: Degenerative changes noted of the left great toe. No acute bony or
joint abnormality identified. No evidence of fracture or
dislocation. No radiopaque foreign bodies.
IMPRESSION: Degenerative changes left great toe. No acute abnormality otherwise
noted.

## 2017-02-25 ENCOUNTER — Ambulatory Visit: Payer: Self-pay | Admitting: Physician Assistant

## 2017-03-01 ENCOUNTER — Ambulatory Visit: Payer: Self-pay | Admitting: Family Medicine

## 2017-03-01 NOTE — Progress Notes (Deleted)
  No chief complaint on file.   HPI  4 review of systems  Past Medical History:  Diagnosis Date  . Barrett's esophagus   . Seasonal allergies     Current Outpatient Prescriptions  Medication Sig Dispense Refill  . hydrocortisone (ANUSOL-HC) 25 MG suppository Place 1 suppository (25 mg total) rectally 2 (two) times daily. 14 suppository 1  . methylPREDNISolone (MEDROL DOSEPAK) 4 MG TBPK tablet Take 6-5-4-3-2-1 po qd 21 tablet 0  . omeprazole (PRILOSEC) 20 MG capsule Take 20 mg by mouth daily.     No current facility-administered medications for this visit.     Allergies:  Allergies  Allergen Reactions  . Erythromycin Hives    Past Surgical History:  Procedure Laterality Date  . ANTERIOR CRUCIATE LIGAMENT REPAIR    . KNEE ARTHROSCOPY W/ ACL RECONSTRUCTION    . KNEE ARTHROSCOPY W/ MENISCAL REPAIR    . KNEE CARTILAGE SURGERY      Social History   Social History  . Marital status: Married    Spouse name: N/A  . Number of children: N/A  . Years of education: N/A   Social History Main Topics  . Smoking status: Never Smoker  . Smokeless tobacco: Never Used  . Alcohol use Yes     Comment: occasional  . Drug use: No  . Sexual activity: Not on file   Other Topics Concern  . Not on file   Social History Narrative   ** Merged History Encounter **        No family history on file.   ROS Review of Systems See HPI Constitution: No fevers or chills No malaise No diaphoresis Skin: No rash or itching Eyes: no blurry vision, no double vision GU: no dysuria or hematuria Neuro: no dizziness or headaches * all others reviewed and negative   Objective: There were no vitals filed for this visit.  Physical Exam  Assessment and Plan There are no diagnoses linked to this encounter.   Delores P PPL Corporationaddy

## 2017-03-24 ENCOUNTER — Ambulatory Visit (HOSPITAL_COMMUNITY)
Admission: EM | Admit: 2017-03-24 | Discharge: 2017-03-24 | Disposition: A | Discharge status: Home / Self Care | Payer: Commercial Managed Care - PPO | Attending: Internal Medicine | Admitting: Internal Medicine

## 2017-03-24 ENCOUNTER — Encounter (HOSPITAL_COMMUNITY): Payer: Self-pay | Admitting: Emergency Medicine

## 2017-03-24 DIAGNOSIS — R519 Headache, unspecified: Secondary | ICD-10-CM

## 2017-03-24 DIAGNOSIS — R51 Headache: Secondary | ICD-10-CM

## 2017-03-24 MED ORDER — DIPHENHYDRAMINE HCL 25 MG PO CAPS: 25.0000 mg | ORAL_CAPSULE | Freq: Four times a day (QID) | ORAL | 0 refills | Status: AC | PRN

## 2017-03-24 MED ORDER — ONDANSETRON HCL 4 MG PO TABS
4.0000 mg | ORAL_TABLET | Freq: Four times a day (QID) | ORAL | 0 refills | Status: AC
Start: 2017-03-24 — End: ?

## 2017-03-24 MED ORDER — IBUPROFEN 800 MG PO TABS: 800.0000 mg | ORAL_TABLET | Freq: Three times a day (TID) | ORAL | 0 refills | Status: AC | PRN

## 2017-03-24 NOTE — Discharge Instructions (Signed)
Please take an ibuprofen, zofran and benadryl today to abort headache. Rest. Push fluids. Please have your BP rechecked next week and establish with a PCP for continued monitoring of this. Benadryl may help with nasal congestion as well.

## 2017-03-24 NOTE — ED Provider Notes (Signed)
[facility]    CSN: [id number] Arrival date & time: 03/24/17  1405     History   Chief Complaint Chief Complaint  Patient presents with  . Headache    HPI Andrew Dawson is a 46 y.o. male.   Andrew Dawson presents with complaints of headache which has waxed and waned for the past 5 days. Denies history of migraine but feels similar to other headaches he has had, although does not get them frequently. States he has been stressed recently, he just resigned from his job. Tylenol has helped some. States the pain is all over his head, throbbing in nature. Light and sound make headache worse. Denies fevers, nausea, vomiting. Mild nasal congestion. States today's BP is higher than normal for him. Lying down and sleeping helps headache. Denies dizziness or head injury.    ROS per HPI.       Past Medical History:  Diagnosis Date  . Barrett's esophagus   . Seasonal allergies     Patient Active Problem List   Diagnosis Date Noted  . 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 Old complete tear of anterior cruciate ligament of right knee 02/11/2014    Past Surgical History:  Procedure Laterality Date  . ANTERIOR CRUCIATE LIGAMENT REPAIR    . KNEE ARTHROSCOPY W/ ACL RECONSTRUCTION    . KNEE ARTHROSCOPY W/ MENISCAL REPAIR    . KNEE CARTILAGE SURGERY         Home Medications    Prior to Admission medications   Medication Sig Start Date End Date Taking? Authorizing Provider  cholecalciferol (VITAMIN D) 400 units TABS tablet Take 400 Units by mouth.   Yes [provider]  omeprazole (PRILOSEC) 20 MG capsule Take 20 mg by mouth daily.   Yes [provider]  diphenhydrAMINE (BENADRYL) 25 mg capsule Take 1 capsule (25 mg total) every 6 (six) hours as needed by mouth. 03/24/17   Burky, Barron AlvineNatalie B, NP  hydrocortisone (ANUSOL-HC) 25 MG suppository Place 1 suppository (25 mg total) rectally  2 (two) times daily. 09/08/16   Deatra Canterxford, William J, FNP  ibuprofen (ADVIL,MOTRIN) 800 MG tablet Take 1 tablet (800 mg total) every 8 (eight) hours as needed by mouth. 03/24/17   Georgetta HaberBurky, Natalie B, NP  methylPREDNISolone (MEDROL DOSEPAK) 4 MG TBPK tablet Take 6-5-4-3-2-1 po qd 09/08/16   Deatra Canterxford, William J, FNP  ondansetron (ZOFRAN) 4 MG tablet Take 1 tablet (4 mg total) every 6 (six) hours by mouth. 03/24/17   Georgetta HaberBurky, Natalie B, NP    Family History History reviewed. No pertinent family history.  Social History Social History   Tobacco Use  . Smoking status: Never Smoker  . Smokeless tobacco: Never Used  Substance Use Topics  . Alcohol use: Yes    Comment: occasional  . Drug use: No     Allergies   Erythromycin   Review of Systems Review of Systems   Physical Exam Triage Vital Signs ED Triage Vitals  Enc Vitals Group     BP 03/24/17 1430 (!) 138/96     Pulse Rate 03/24/17 1430 89     Resp 03/24/17 1430 20     Temp 03/24/17 1430 99.4 F (37.4 C)     Temp Source 03/24/17 1430 Oral     SpO2 03/24/17 1430 98 %     Weight --      Height --      Head Circumference --  Peak Flow --      Pain Score 03/24/17 1432 4     Pain Loc --      Pain Edu? --      Excl. in GC? --    No data found.  Updated Vital Signs BP (!) 138/96 (BP Location: Left Arm)   Pulse 89   Temp 99.4 F (37.4 C) (Oral)   Resp 20   SpO2 98%   Visual Acuity Right Eye Distance:   Left Eye Distance:   Bilateral Distance:    Right Eye Near:   Left Eye Near:    Bilateral Near:     Physical Exam  Constitutional: He is oriented to person, place, and time. He appears well-developed and well-nourished.  HENT:  Head: Normocephalic and atraumatic.  Mouth/Throat: Oropharynx is clear and moist.  Eyes: EOM are normal. Pupils are equal, round, and reactive to light.  Neck: Normal range of motion.  Cardiovascular: Normal rate and regular rhythm.  Pulmonary/Chest: Effort normal and breath sounds normal.    Neurological: He is alert and oriented to person, place, and time. He has normal strength. He displays a negative Romberg sign. Coordination normal. GCS eye subscore is 4. GCS verbal subscore is 5. GCS motor subscore is 6.  Alert, non distressed; using phone during exam  Skin: Skin is warm and dry.  Psychiatric: He has a normal mood and affect.  Vitals reviewed.    UC Treatments / Results  Labs (all labs ordered are listed, but only abnormal results are displayed) Labs Reviewed - No data to display  EKG  EKG Interpretation None       Radiology No results found.  Procedures Procedures (including critical care time)  Medications Ordered in UC Medications - No data to display   Initial Impression / Assessment and Plan / UC Course  I have reviewed the triage vital signs and the nursing notes.  Pertinent labs & imaging results that were available during my care of the patient were reviewed by me and considered in my medical decision making (see chart for details).     Patient declines any IM treatments in clinic today, states he will take oral treatments at home. Recommended ibuprofen, benadryl and zofran. Rest. Recommended establish with a PCP, follow up if needed if symptoms persist or do not improve. Return precautions provided. Patient verbalized understanding and agreeable to plan.    Final Clinical Impressions(s) / UC Diagnoses   Final diagnoses:  Bad headache    ED Discharge Orders        Ordered    ibuprofen (ADVIL,MOTRIN) 800 MG tablet  Every 8 hours PRN     03/24/17 1505    diphenhydrAMINE (BENADRYL) 25 mg capsule  Every 6 hours PRN     03/24/17 1505    ondansetron (ZOFRAN) 4 MG tablet  Every 6 hours     03/24/17 1505       Controlled Substance Prescriptions [place] Controlled Substance Registry consulted? Not Applicable   Georgetta HaberBurky, Natalie B, NP 03/24/17 1531

## 2017-03-24 NOTE — ED Triage Notes (Signed)
PT C/O: intermediate HAs   ONSET: 5 days  SX INCLUDE: none  DENIES: n/v  TAKING MEDS: acetaminophen w/no relief.   Pt is on phone texting/plahing    A&O x4... NAD... Ambulatory

## 2017-08-07 ENCOUNTER — Encounter: Payer: Self-pay | Admitting: Physician Assistant

## 2019-08-09 ENCOUNTER — Telehealth: Admit: 2019-08-09 | Payer: PRIVATE HEALTH INSURANCE

## 2019-08-09 NOTE — Telephone Encounter
Pt calling to schedule an appt with Dr. Judi Saa for colon and rectal- advised provider does not have any clinics as of yet.the patient will have his records sent over to [place], advised records will be scanned into his chart for review and will be advised when clinic is open for Dr. April Manson he is new to our team does not have a schedule yet - he will be out of [place] (i think with [place] office

## 2019-08-28 ENCOUNTER — Telehealth: Admit: 2019-08-28 | Payer: PRIVATE HEALTH INSURANCE | Attending: Colon & Rectal Surgery

## 2019-08-30 ENCOUNTER — Telehealth: Admit: 2019-08-30 | Payer: PRIVATE HEALTH INSURANCE | Attending: Colon & Rectal Surgery

## 2019-08-30 DIAGNOSIS — K601 Chronic anal fissure: Secondary | ICD-10-CM

## 2019-09-02 ENCOUNTER — Encounter: Admit: 2019-09-02 | Payer: PRIVATE HEALTH INSURANCE | Attending: Colon & Rectal Surgery

## 2019-09-03 ENCOUNTER — Encounter: Admit: 2019-09-03 | Payer: PRIVATE HEALTH INSURANCE | Attending: Colon & Rectal Surgery

## 2019-09-03 NOTE — Progress Notes
TELEPHONE VISIT: For this visit the clinician and patient were present via telephone (audio only).Patient counseled on available options for visit type; Patient elected telephone visit; Patient consent given for telephone visit: YesPatient Identity was confirmed during this call.? Other individuals actively participating in the telephone encounter and their name/relation to the patient: noneTotal time spent in medical telephone consultation: 20Because this visit was completed over telephone, a hands-on physical exam was not performed.? Patient understands and knows to call back if condition changes. The visit type for this patient required modifications due to the COVID-19 outbreak.Patient discussed his interest in traveling from [place] to [place] for his care. Requested therapy for his anal fissure. Informed of limitation due to state licensing for video or advice on medical care.  He agrees to travel for an appointment to [facility] and undergo evaluation and possible treatment on the same day if possible.

## 2019-09-06 ENCOUNTER — Encounter: Admit: 2019-09-06 | Payer: PRIVATE HEALTH INSURANCE | Attending: Family

## 2019-09-06 DIAGNOSIS — Z01818 Encounter for other preprocedural examination: Secondary | ICD-10-CM

## 2019-09-12 ENCOUNTER — Telehealth: Admit: 2019-09-12 | Payer: PRIVATE HEALTH INSURANCE | Attending: Colon & Rectal Surgery

## 2019-09-12 NOTE — Telephone Encounter
Left message for pt to call me back to discuss 5/13 procedure preparation and instructions.Lafonda Mosses, RN

## 2019-09-16 ENCOUNTER — Telehealth: Admit: 2019-09-16 | Payer: PRIVATE HEALTH INSURANCE | Attending: Colon & Rectal Surgery

## 2019-09-16 NOTE — Telephone Encounter
..  [facility] MESSAGETime of call:   11:26 AMCaller:  Joseph NarulaCaller's relationship to patient:  selfCalling from (pharmacy, hospital, agency, etc.):  homeReason for call:   Patient calling to reschedule surgery scheduled with Dr. Judi Saa this Thursday 05/13If not feeling well, what are symptoms: If having symptoms, how long have the symptoms been present:  Best telephone number for callback:   [phone] time to return call:   any Permission to leave message:  [facility] Specialist

## 2019-09-16 NOTE — Telephone Encounter
Spoke with patient and cancelled procedure for 5/13 per his request.  Rescheduled for Friday, 5/21 (pt requested Friday) in Main OR [facility]) with Dr. Judi Saa at 7:45am.  Will f/u with pt on Monday, 5/17 to review pre-op teaching. Pt reports he is feeling better and may not need procedure-will monitor his progress and report to [place].Lafonda Mosses, RN

## 2019-09-17 ENCOUNTER — Encounter: Admit: 2019-09-17 | Payer: PRIVATE HEALTH INSURANCE | Attending: Adult Health

## 2019-09-17 DIAGNOSIS — Z01818 Encounter for other preprocedural examination: Secondary | ICD-10-CM

## 2019-09-23 ENCOUNTER — Telehealth: Admit: 2019-09-23 | Payer: PRIVATE HEALTH INSURANCE | Attending: Colon & Rectal Surgery

## 2019-09-23 NOTE — Telephone Encounter
Patient had questions about COVID  Test. Called to give him the COVID line for patients but line is busy.

## 2019-09-24 ENCOUNTER — Telehealth: Admit: 2019-09-24 | Payer: PRIVATE HEALTH INSURANCE

## 2019-09-24 NOTE — Telephone Encounter
Contacted patient regarding COVID testing. Dr. Judi Saa recommends he arrive the day prior to procedure for a COVID test in the morning. Patient given COVID testing number to schedule test for Thursday morning. Verbalizes understanding. Patient also requested [place] approved COVID testing site list- emailed to patient. Patient to call with questions or concerns.

## 2019-09-25 NOTE — Telephone Encounter
Appt confirmed

## 2019-09-26 ENCOUNTER — Encounter: Admit: 2019-09-26 | Payer: PRIVATE HEALTH INSURANCE

## 2019-09-26 ENCOUNTER — Telehealth: Admit: 2019-09-26 | Payer: PRIVATE HEALTH INSURANCE | Attending: Colon & Rectal Surgery

## 2019-09-26 ENCOUNTER — Inpatient Hospital Stay: Admit: 2019-09-26 | Discharge: 2019-09-26 | Payer: PRIVATE HEALTH INSURANCE

## 2019-09-26 DIAGNOSIS — Z20822 Contact with and (suspected) exposure to covid-19: Secondary | ICD-10-CM

## 2019-09-26 DIAGNOSIS — K2270 Barrett's esophagus without dysplasia: Secondary | ICD-10-CM

## 2019-09-26 DIAGNOSIS — Z01818 Encounter for other preprocedural examination: Secondary | ICD-10-CM

## 2019-09-26 DIAGNOSIS — M1990 Unspecified osteoarthritis, unspecified site: Secondary | ICD-10-CM

## 2019-09-26 DIAGNOSIS — Z01812 Encounter for preprocedural laboratory examination: Secondary | ICD-10-CM

## 2019-09-26 LAB — COVID-19 CLEARANCE OR FOR PLACEMENT ONLY: BKR SARS-COV-2 RNA (COVID-19) (YH): NEGATIVE

## 2019-09-26 NOTE — Telephone Encounter
Contacted patient. Patient completed COVID test today. To arrive to [facility] tomorrow at 6:30 for procedure. Patints wife to accompany him. Pt reports that PAT said he can take his omeprazole in the morning. Pt asks if he can take miralax at night, instructed that he can as long as its before MN. Nothing to eat or drink after MN. Verbalized understanding of instructions. Will call with questions or concerns.

## 2019-09-27 ENCOUNTER — Ambulatory Visit: Admit: 2019-09-27 | Payer: PRIVATE HEALTH INSURANCE | Attending: Certified Registered"

## 2019-09-27 ENCOUNTER — Inpatient Hospital Stay: Admit: 2019-09-27 | Discharge: 2019-09-27 | Payer: PRIVATE HEALTH INSURANCE

## 2019-09-27 ENCOUNTER — Telehealth: Admit: 2019-09-27 | Payer: PRIVATE HEALTH INSURANCE | Attending: Colon & Rectal Surgery

## 2019-09-27 ENCOUNTER — Encounter: Admit: 2019-09-27 | Payer: PRIVATE HEALTH INSURANCE | Attending: Surgical Critical Care

## 2019-09-27 DIAGNOSIS — Z881 Allergy status to other antibiotic agents status: Secondary | ICD-10-CM

## 2019-09-27 DIAGNOSIS — K643 Fourth degree hemorrhoids: Secondary | ICD-10-CM

## 2019-09-27 DIAGNOSIS — K2270 Barrett's esophagus without dysplasia: Secondary | ICD-10-CM

## 2019-09-27 DIAGNOSIS — K601 Chronic anal fissure: Secondary | ICD-10-CM

## 2019-09-27 DIAGNOSIS — K602 Anal fissure, unspecified: Secondary | ICD-10-CM

## 2019-09-27 DIAGNOSIS — M1990 Unspecified osteoarthritis, unspecified site: Secondary | ICD-10-CM

## 2019-09-27 DIAGNOSIS — K649 Unspecified hemorrhoids: Secondary | ICD-10-CM

## 2019-09-27 MED ORDER — FENTANYL (PF) 50 MCG/ML INJECTION SOLUTION
50 mcg/mL | INTRAVENOUS | Status: DC | PRN
Start: 2019-09-27 — End: 2019-09-27

## 2019-09-27 MED ORDER — FENTANYL (PF) 50 MCG/ML INJECTION SOLUTION
50 mcg/mL | Status: CP
Start: 2019-09-27 — End: ?

## 2019-09-27 MED ORDER — BUPIVACAINE-EPINEPHRINE (PF) 0.5 %-1:200,000 INJECTION SOLUTION
0.5 %-1:200,000 | Status: CP
Start: 2019-09-27 — End: ?

## 2019-09-27 MED ORDER — LIDOCAINE (PF) 20 MG/ML (2 %) INTRAVENOUS SOLUTION
20 mg/mL (2 %) | Status: CP
Start: 2019-09-27 — End: ?

## 2019-09-27 MED ORDER — PROPOFOL 10 MG/ML INTRAVENOUS EMULSION
10 mg/mL | Status: CP
Start: 2019-09-27 — End: ?

## 2019-09-27 MED ORDER — MIDAZOLAM (PF) 1 MG/ML INJECTION SOLUTION
1 mg/mL | Status: CP
Start: 2019-09-27 — End: ?

## 2019-09-27 MED ORDER — MIDAZOLAM (PF) 1 MG/ML INJECTION SOLUTION
1 mg/mL | Status: DC | PRN
Start: 2019-09-27 — End: 2019-09-27
  Administered 2019-09-27: 12:00:00 1 mg/mL via INTRAVENOUS

## 2019-09-27 MED ORDER — FENTANYL (PF) 50 MCG/ML INJECTION SOLUTION
50 mcg/mL | Status: DC | PRN
Start: 2019-09-27 — End: 2019-09-27
  Administered 2019-09-27 (×3): 50 mcg/mL via INTRAVENOUS

## 2019-09-27 MED ORDER — LACTATED RINGERS INTRAVENOUS SOLUTION
Status: DC | PRN
Start: 2019-09-27 — End: 2019-09-27
  Administered 2019-09-27: 12:00:00 via INTRAVENOUS

## 2019-09-27 MED ORDER — SODIUM CHLORIDE 0.9 % (FLUSH) INJECTION SYRINGE
0.9 % | Freq: Three times a day (TID) | INTRAVENOUS | Status: DC
Start: 2019-09-27 — End: 2019-09-27

## 2019-09-27 MED ORDER — LACTATED RINGERS INTRAVENOUS SOLUTION
INTRAVENOUS | Status: DC
Start: 2019-09-27 — End: 2019-09-27

## 2019-09-27 MED ORDER — DIPHENHYDRAMINE 50 MG/ML INJECTION SOLUTION
50 mg/mL | INTRAVENOUS | Status: DC | PRN
Start: 2019-09-27 — End: 2019-09-27

## 2019-09-27 MED ORDER — BUPIVACAINE-EPINEPHRINE (PF) 0.5 %-1:200,000 INJECTION SOLUTION
0.5 %-1:200,000 | Status: DC | PRN
Start: 2019-09-27 — End: 2019-09-27
  Administered 2019-09-27: 12:00:00 0.5 %-1:200,000

## 2019-09-27 MED ORDER — OXYCODONE IMMEDIATE RELEASE 5 MG TABLET
5 mg | ORAL | Status: DC | PRN
Start: 2019-09-27 — End: 2019-09-27

## 2019-09-27 MED ORDER — ONABOTULINUMTOXINA 100 UNIT SOLUTION FOR INJECTION
100 unit | Freq: Once | INTRADERMAL | Status: CP
Start: 2019-09-27 — End: ?
  Administered 2019-09-27: 12:00:00 100 unit via INTRADERMAL

## 2019-09-27 MED ORDER — PROPOFOL 10 MG/ML INTRAVENOUS EMULSION
10 mg/mL | Status: DC | PRN
Start: 2019-09-27 — End: 2019-09-27
  Administered 2019-09-27 (×6): 10 mg/mL via INTRAVENOUS

## 2019-09-27 MED ORDER — SODIUM CHLORIDE 0.9 % (FLUSH) INJECTION SYRINGE
0.9 % | INTRAVENOUS | Status: DC | PRN
Start: 2019-09-27 — End: 2019-09-27

## 2019-09-27 MED ORDER — HYDROMORPHONE 0.5 MG/0.5 ML INJECTION SYRINGE
0.5 mg/ mL | INTRAVENOUS | Status: DC | PRN
Start: 2019-09-27 — End: 2019-09-27

## 2019-09-27 MED ORDER — ONDANSETRON HCL (PF) 4 MG/2 ML INJECTION SOLUTION
4 mg/2 mL | INTRAVENOUS | Status: DC | PRN
Start: 2019-09-27 — End: 2019-09-27

## 2019-09-27 NOTE — Other
Post Anesthesia Transfer of Care NotePatient: Joseph NarulaProcedure(s) Performed: Procedure(s) (LRB):anorectal exam under anesthesia and botox (N/A) Patient location: PACU Last Vitals: Vitals Value Taken Time BP 110/65 09/27/19 0825 Temp 36.1 ?C 09/27/19 0822 Pulse 67 09/27/19 0825 Resp 14 09/27/19 0825 SpO2 97 09/27/19 0826 Vitals shown include unvalidated device data.Level of consciousness: sedatedTransport Vital Signs:  Stable since the last set of recorded intra-operative vital signsComplications: noneIntra-operative Intake & Output and Antibiotics as per Anesthesia record and discussed with the RN.

## 2019-09-27 NOTE — Anesthesia Post-Procedure Evaluation
Anesthesia Post-op NotePatient: Joseph NarulaProcedure(s):  Procedure(s) (LRB):anorectal exam under anesthesia and botox (N/A) Patient location: PACULast Vitals:  I have noted the vital signs as listed in the nursing notes.Mental status recovered: patient participates in evaluation: YesVital signs reviewed: YesRespiratory function stable:YesAirway is patent: YesCardiovascular function and hydration status stable: YesPain control satisfactory: YesNausea and vomiting control satisfactory:Yes

## 2019-09-27 NOTE — Other
Operative Diagnosis:Pre-op:   * No pre-op diagnosis entered * Patient Coded Diagnosis   Pre-op diagnosis: Anal fissure  Post-op diagnosis: Anal fissure  Patient Diagnosis   None    Post-op diagnosis:   * Anal fissure [K60.2]Operative Procedure(s) :Procedure(s) (LRB):anorectal exam under anesthesia and botox (N/A)Post-op Procedure & Diagnosis ConfirmationPost-op Diagnosis: Post-op Diagnosis confirmed (no changes)Post-op Procedure: Post-op Procedure confirmed (no changes)

## 2019-09-27 NOTE — Discharge Instructions
Patient Education Patient Education Anal Fissure, AdultAn anal fissure is a small tear or crack in the tissue of the anus. Bleeding from a fissure usually stops on its own within a few minutes. However, bleeding will often occur again with each bowel movement until the fissure heals.What are the causes?This condition is usually caused by passing a large or hard stool (feces). Other causes include:?	Constipation.?	Frequent diarrhea.?	Inflammatory bowel disease (Crohn's disease or ulcerative colitis).?	Childbirth.?	Infections.?	Anal sex.What are the signs or symptoms?Symptoms of this condition include:?	Bleeding from the rectum.?	Small amounts of blood seen on your stool, on the toilet paper, or in the toilet after a bowel movement. The blood coats the outside of the stool and is not mixed with the stool.?	Painful bowel movements.?	Itching or irritation around the anus.How is this diagnosed?A health care provider may diagnose this condition by closely examining the anal area. An anal fissure can usually be seen with careful inspection. In some cases, a rectal exam may be performed, or a short tube (anoscope) may be used to examine the anal canal.How is this treated?Initial treatment for this condition may include:?	Taking steps to avoid constipation. This may include making changes to your diet, such as increasing your intake of fiber or fluid.?	Taking fiber supplements. These supplements can soften your stool to help make bowel movements easier. Your health care provider may also prescribe a stool softener if your stool is hard.?	Taking sitz baths. This may help to heal the tear.?	Using medicated creams or ointments. These may be prescribed to lessen discomfort.Treatments that are sometimes used if initial treatments do not work well or if the condition is more severe may include:?	Botulinum injection.?	Surgery to repair the fissure.Follow these instructions at home:Eating and drinking?	Avoid foods that may cause constipation, such as bananas, milk, and other dairy products.?	Eat all fruits, except bananas.?	Drink enough fluid to keep your urine pale yellow.?	Eat foods that are high in fiber, such as beans, whole grains, and fresh fruits and vegetables.General instructions?	Take over-the-counter and prescription medicines only as told by your health care provider.?	Use creams or ointments only as told by your health care provider.?	Keep the anal area clean and dry.?	Take sitz baths as told by your health care provider. Do not use soap in the sitz baths.?	Keep all follow-up visits as told by your health care provider. This is important.Contact a health care provider if you have:?	More bleeding.?	A fever.?	Diarrhea that is mixed with blood.?	Pain that continues.?	Ongoing problems that are getting worse rather than better.Summary?	An anal fissure is a small tear or crack in the tissue of the anus. This condition is usually caused by passing a large or hard stool (feces). Other causes include constipation and frequent diarrhea.?	Initial treatment for this condition may include taking steps to avoid constipation, such as increasing your intake of fiber or fluid.?	Follow instructions for care as told by your health care provider.?	Contact your health care provider if you have more bleeding or your problem is getting worse rather than better.?	Keep all follow-up visits as told by your health care provider. This is important.This information is not intended to replace advice given to you by your health care provider. Make sure you discuss any questions you have with your health care provider.Document Released: 04/25/2005 Document Revised: 10/05/2017 Document Reviewed: 05/30/2019Elsevier Patient Education ? 2020 Elsevier Inc. Botulinum Toxin  Injection, Care AfterThese instructions give you information about caring for yourself after your procedure. Your doctor may also give you more specific instructions. Call your doctor if you have any problems or questions after your procedure.Follow these instructions at home:?	Do not do any activities that take a lot of  effort (are strenuous) for 2 hours after the procedure or for as long as told by your doctor. This includes:?	Lifting heavy items.?	Working out.?	Doing activities that make your heart beat more quickly.?	Take over-the-counter and prescription medicines only as told by your doctor.  Tylenol 650 mg every 6 hours as needed for pain.?	Keep all follow-up visits with your doctor. This is important.Contact a doctor if:?	You have neck pain.?	You have a headache that gets worse.?	You feel sick to your stomach (nauseous) and it gets worse.?	You feel unusually sleepy.?	You have pain, tightness, weakness, or spasms in your face or neck.?	Your mouth is dry.?	You have trouble pooping (constipation).?	You feel worried or nervous (anxious).Get help right away if:?	You have chest pain.?	You faint.?	You have a fever.?	You have signs of an allergic reaction. These include:?	An itchy rash or welts.?	Wheezing or shortness of breath.?	Dizziness.?	You have double or blurred vision.?	The black centers of your eyes (pupils) start to get bigger (dilate) and are sensitive to light.?	Your eyelids start to get droopy or swollen.?	You have trouble speaking.?	You feel short of breath or have trouble breathing.?	Your voice changes or starts to sound hoarse.?	You start to have trouble swallowing.?	You have muscle pain, weakness, or spasms in other parts of your body.This information is not intended to replace advice given to you by your health care provider. Make sure you discuss any questions you have with your health care provider.Document Released: 11/25/2010 Document Revised: 10/01/2015 Document Reviewed: 06/22/2016Elsevier Interactive Patient Education ? 2019 Elsevier Inc.

## 2019-09-27 NOTE — Telephone Encounter
Called PT to schedule a follow-up appointment.No answer, Left VM to contact our office to schedule an appointment.

## 2019-09-27 NOTE — Anesthesia Pre-Procedure Evaluation
This is a 49 y.o. male scheduled for anorectal exam under anesthesia and botox (N/A ).Review of Systems/ Medical HistoryPatient summary, nursing notes, EKG/Cardiac Studies , Labs, pre-procedure vitals, height, weight and NPO status reviewed.No previous anesthesia concernsAnesthesia Evaluation:   No history of anesthetic complications  Estimated body mass index is 24.65 kg/m? as calculated from the following:  Height as of this encounter: 5' 7 (1.702 m).  Weight as of this encounter: 71.4 kg. Cardiovascular: Negative   -Exercise tolerance: >4 METS -Vascular Disease:  Negative   Respiratory:  Negative.HEENT: Negative.Neuromuscular: NegativeSkeletal/Skin:  NegativeGastrointestinal/Genitourinary: -Gastrointestinal Disorders:  Patient has Barrett's esophagus.Hematological/Lymphatic: NegativeEndocrine/Metabolic:  Negative.Behavioral/Psychiatric & Syndromes: NegativePhysical ExamCardiovascular:  Rhythm: regularHeart Sounds: S1 present and S2 present.Pulmonary:  normal exam  Patient's breath sounds clear to auscultationAirway:  Mallampati: IITM distance: >3 FBNeck ROM: fullDental:  normal exam  Anesthesia PlanASA 2 The primary anesthesia plan is  MAC. Perioperative Code Status confirmed: It is my understanding that the patient is currently designated as 'Full Code' and will remain so throughout the perioperative period.Anesthesia informed consent obtained. Consent obtained from: patientUse of blood products: consented  The post operative pain plan is per surgeon management.Plan discussed with Attending and CRNA.Anesthesiologist's Pre Op NoteI personally evaluated and examined the patient prior to the intra-operative phase of care.

## 2019-09-27 NOTE — H&P
History and Physical 49 year old for consultation possible fissure and hemorrhoids. Constipation many months ago with resulted in anal fissure and hemorrhoid flare up. Bleeding intermittently for several weeks with intermittent pain.  He has been using topical therapy for the fissure with some improvement.  He resides in [place] and a phone visit was performed to review his ongoing symptoms.  He has seen multiple specialists including Dr. Rosalia Hammers at [facility]. He has been advised to pursue surgery with a Lateral internal sphincterotomy and hemorrhoidectomy. He tried nifedipine for 1 week but it caused him pain and he stopped.Current Outpatient Medications Medication Sig Dispense Refill ? omeprazole (PRILOSEC) 20 MG capsule No current facility-administered medications for this visit. Allergies Allergen Reactions ? Erythromycin Hives Social History Tobacco Use ? Smoking status: Never Smoker ? Smokeless tobacco: Never Used Substance Use Topics ? Alcohol use: Yes ? Drug use: Never Past Medical History: Diagnosis Date ? Barrett's esophagus Past Surgical History: Procedure Laterality Date ? KNEE ARTHROPLASTY Bilateral 2 right knee on on left Vitals:  09/27/19 0705 BP: 134/88 Pulse: 65 Resp: 20 Temp: 98.4 ?F (36.9 ?C) Physical ExamVitals signs and nursing note reviewed. Exam conducted with a chaperone present. Constitutional:     Appearance: Normal appearance. HENT:    Head: Normocephalic and atraumatic. Eyes:    Conjunctiva/sclera: Conjunctivae normal. Cardiovascular:    Rate and Rhythm: Normal rate and regular rhythm. Pulmonary:    Effort: Pulmonary effort is normal. No respiratory distress. Abdominal:    General: There is no distension.    Palpations: Abdomen is soft.    Tenderness: There is no abdominal tenderness. There is no right CVA tenderness or guarding. Skin:   General: Skin is warm. Capillary Refill: Capillary refill takes less than 2 seconds.    Coloration: Skin is not jaundiced. Neurological:    General: No focal deficit present.    Mental Status: He is alert and oriented to person, place, and time. Impression:49 year old male with intermittent rectal bleeding , ongoing rectal pain.  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  Plan for Exam under anesthesia and possible botox injection.  Risks reviewed and patient understands if fissure has healed we will not inject botox.  He also understands that Lateral internal spincterotomy will be more effective treatment but he declines this approach and understands that botox treatment may only help to achieve fissure healing in up to 50% of patients.  Risks of procedure reviewed and informed consent obtained.

## 2019-09-27 NOTE — Telephone Encounter
Dr. Judi Saa notified.

## 2019-09-27 NOTE — Telephone Encounter
Sue Lush called from [facility]- requesting a call back from Dr. Judi Saa.Pt is looking for a medication - ointment.Please call her back at [phone].

## 2019-09-27 NOTE — Other
Anorectal exam under anesthesia and botox  Procedure NoteGurpreet Horne Date of Birth May 07, 1972MRN [id number] Date of Procedure: 5/21/2021PRE-OP DIAGNOSIS: Chronic anal fissure   POST-OP DIAGNOSIS: Chronic anal fissure, grade 4 internal hemorrhoids   PROCEDURE(S): Procedure(s):anorectal exam under anesthesia and botoxANESTHESIA: MONITORED ANESTHESIA CARESURGEON(S): Surgeon(s):Zola Runion, MDSTAFF: Circulator: [place], [place], RNScrub Person: Ephriam Knuckles, TalmadgeESTIMATED BLOOD LOSS: Minimal       DRAINS: none        TOTAL IV FLUIDS: see anesthesia note       SPECIMENS: none       IMPLANTS: none       COMPLICATIONS:  noneFINDINGS: posterior midline chronic anal fissure       DISPOSITION:  awakened from anesthesia, extubated and taken to the recovery room in a stable condition, having suffered no apparent untoward event.       CONDITION: doing well without problems  TECHNIQUE:  Patient was seen and examined in the preoperative area Informed consent was confirmed.  Patient was identified and brought back to the operating room.  Patient was positioned in a prone position.  All pressure points were carefully padded.  The perineum was prepped and draped in usual sterile fashion.  A digital rectal examination and a circumferential examination of the distal rectum and anus was performed using Hill-Ferguson retractors and a Pratt bivalve retractor.  There was a right posterior prolapsed internal hemorrhoids consistent with grade 3 disease.  He also was noted to have a chronic midline posterior anal fissure with associated sentinel tag.  No obvious fistula or abscess was noted.  We then prepped the perineum with Betadine changed our gloves and then injected approximately 40 units of Botox into the the sphincter complex using standard spinal needle after identifying the intersphincteric groove.  An additional 3 cc's of 0.5% Marcaine was injected into the anal fissure bed for pain control.  The area was inspected and found to be hemostatic and the patient was awakened from anesthesia and brought to the PACU in stable condition.

## 2019-09-30 NOTE — Telephone Encounter
Those are my two days for now so Please let him know those are the days we have at this timeThanksAK

## 2019-10-28 ENCOUNTER — Encounter: Admit: 2019-10-28 | Payer: PRIVATE HEALTH INSURANCE | Attending: Colon & Rectal Surgery

## 2019-10-28 ENCOUNTER — Ambulatory Visit: Admit: 2019-10-28 | Payer: PRIVATE HEALTH INSURANCE | Attending: Colon & Rectal Surgery

## 2019-10-28 DIAGNOSIS — K2270 Barrett's esophagus without dysplasia: Secondary | ICD-10-CM

## 2019-10-28 DIAGNOSIS — K649 Unspecified hemorrhoids: Secondary | ICD-10-CM

## 2019-10-28 DIAGNOSIS — K602 Anal fissure, unspecified: Secondary | ICD-10-CM

## 2019-10-28 DIAGNOSIS — M1990 Unspecified osteoarthritis, unspecified site: Secondary | ICD-10-CM

## 2019-10-28 MED ORDER — OMEPRAZOLE 20 MG CAPSULE,DELAYED RELEASE
20 mg | Freq: Every day | ORAL | Status: AC
Start: 2019-10-28 — End: ?

## 2019-10-28 NOTE — Progress Notes
Subjective:   Joseph Horne is a 49 y.o. male  who had concerns including Follow-up.HPI 49 year old male with chronic anal fissure treated with botox injection in 09/27/19.  He states that his pain is 1 now but can on some days go up to the level of 7/10.  He reports minimal to no bleeding per rectum.  He states that when he eats non vegeterian meals. He does feel 80% improved with his symptoms of discomfort.Joseph Horne  has a past medical history of Arthritis, Barrett esophagus, and Hemorrhoids (09/27/2019).Joseph Horne Past Surgical History: Procedure Laterality Date ? COLONOSCOPY   ? KNEE ARTHROSCOPY Right   x 2  ? KNEE ARTHROSCOPY Left   x 1  ? UPPER GASTROINTESTINAL ENDOSCOPY   His family history is not on file.Joseph Horne  reports that he has never smoked. He has never used smokeless tobacco. He reports current alcohol use. He reports that he does not use drugs.Joseph Horne has a current medication list which includes the following prescription(s): omeprazole (PRILOSEC) 20 mg capsule - Take 20 mg by mouth daily.Joseph Horne is allergic to erythromycin.Patient Active Problem List Diagnosis ? Hemorrhoids Review of Systems Constitutional: Negative.  HENT: Negative.  Eyes: Negative.  Respiratory: Negative.  Cardiovascular: Negative.  Gastrointestinal: Positive for rectal pain. Endocrine: Negative.  Genitourinary: Negative.  Musculoskeletal: Negative.  Skin: Negative.  Allergic/Immunologic: Negative.  Neurological: Negative.  Hematological: Negative.  Psychiatric/Behavioral: Negative.    Objective:  BP (!) 153/95  - Pulse 86  - Ht 5' 6 (1.676 m)  - Wt 70.3 kg  - SpO2 (!) 93%  - BMI 25.02 kg/m? There were no orthostatic vitals filed for this visit.Physical Exam Constitutional: He is oriented to person, place, and time. He appears well-developed and well-nourished. HENT: Head: Normocephalic and atraumatic. Eyes: Right eye exhibits no discharge. No scleral icterus. Pulmonary/Chest: Effort normal. No respiratory distress. Abdominal: Soft. He exhibits no distension and no mass. There is no abdominal tenderness. There is no rebound and no guarding. Neurological: He is alert and oriented to person, place, and time. Skin: Skin is warm. residual anal fissure noted  Assessment / Plan:  Remains with intermittent symptoms of anal fissureContinue topical diltiazem/LidocaineAdvised LI sphincterotomy but they would like to observe for 2 months and fup to see if improves. Electronically Signed by Riki Sheer, MD, October 28, 2019

## 2019-11-19 ENCOUNTER — Telehealth: Admit: 2019-11-19 | Payer: PRIVATE HEALTH INSURANCE | Attending: Colon & Rectal Surgery

## 2019-11-19 NOTE — Telephone Encounter
[phone] Confirmed pt numberPt req refill of Rx Topical diltiazem/Lidocaine be sent to [facility].Pt uses [facility].Please advise.

## 2019-11-19 NOTE — Telephone Encounter
Returned call. Spoke with pt. Notified that refill was called in.

## 2019-12-16 ENCOUNTER — Encounter: Admit: 2019-12-16 | Payer: PRIVATE HEALTH INSURANCE | Attending: Family

## 2019-12-16 DIAGNOSIS — Z01818 Encounter for other preprocedural examination: Secondary | ICD-10-CM

## 2019-12-23 ENCOUNTER — Telehealth: Admit: 2019-12-23 | Payer: PRIVATE HEALTH INSURANCE | Attending: Colon & Rectal Surgery

## 2019-12-23 NOTE — Telephone Encounter
Called Joseph Horne to obtain his insurance information and see if he indeed wanted to cancel his procedure scheduled for 8/23 - he would like to reschedule to Sept or October as he just started a new job and doesn't have insurance information yet.  He will call us back to reschedule.

## 2019-12-23 NOTE — Telephone Encounter
Spoke to patient.  Offered a surgery date of 01/06/20.  Patient said he would like to give it a little more time and possibly schedule surgery in late September or October.  He will call our office when he is ready to re-schedule.

## 2019-12-30 ENCOUNTER — Encounter: Admit: 2019-12-30 | Payer: PRIVATE HEALTH INSURANCE | Attending: Colon & Rectal Surgery

## 2020-02-03 ENCOUNTER — Telehealth: Admit: 2020-02-03 | Payer: PRIVATE HEALTH INSURANCE | Attending: Colon & Rectal Surgery

## 2020-02-03 NOTE — Telephone Encounter
Patient does not want surgery at this time (he was tentatively scheduled for surgery on 02/20/20).  Patient said his pain is a little less so he is not sure about surgery at this time.  He will call to schedule an appointment with Dr. Judi Saa prior to rescheduling surgery.
